# Patient Record
Sex: Female | Born: 1970 | Race: White | Hispanic: No | Marital: Married | Smoking: Never smoker
Health system: Southern US, Community
[De-identification: ages and names within clinical notes are randomized; demographics above are authoritative.]

## PROBLEM LIST (undated history)

## (undated) DIAGNOSIS — T4145XA Adverse effect of unspecified anesthetic, initial encounter: Secondary | ICD-10-CM

## (undated) DIAGNOSIS — J45909 Unspecified asthma, uncomplicated: Secondary | ICD-10-CM

## (undated) DIAGNOSIS — T8859XA Other complications of anesthesia, initial encounter: Secondary | ICD-10-CM

## (undated) DIAGNOSIS — F32A Depression, unspecified: Secondary | ICD-10-CM

## (undated) DIAGNOSIS — G43909 Migraine, unspecified, not intractable, without status migrainosus: Secondary | ICD-10-CM

## (undated) DIAGNOSIS — G629 Polyneuropathy, unspecified: Secondary | ICD-10-CM

## (undated) DIAGNOSIS — R112 Nausea with vomiting, unspecified: Secondary | ICD-10-CM

## (undated) DIAGNOSIS — G932 Benign intracranial hypertension: Secondary | ICD-10-CM

## (undated) DIAGNOSIS — J309 Allergic rhinitis, unspecified: Secondary | ICD-10-CM

## (undated) DIAGNOSIS — A6000 Herpesviral infection of urogenital system, unspecified: Secondary | ICD-10-CM

## (undated) DIAGNOSIS — G4700 Insomnia, unspecified: Secondary | ICD-10-CM

## (undated) DIAGNOSIS — I10 Essential (primary) hypertension: Secondary | ICD-10-CM

## (undated) DIAGNOSIS — F329 Major depressive disorder, single episode, unspecified: Secondary | ICD-10-CM

## (undated) HISTORY — DX: Polyneuropathy, unspecified: G62.9

## (undated) HISTORY — DX: Herpesviral infection of urogenital system, unspecified: A60.00

## (undated) HISTORY — PX: LAPAROSCOPIC TUBAL LIGATION: SHX1937

## (undated) HISTORY — PX: OOPHORECTOMY: SHX86

## (undated) HISTORY — PX: OTHER SURGICAL HISTORY: SHX169

## (undated) HISTORY — DX: Benign intracranial hypertension: G93.2

## (undated) HISTORY — DX: Insomnia, unspecified: G47.00

## (undated) HISTORY — DX: Migraine, unspecified, not intractable, without status migrainosus: G43.909

## (undated) HISTORY — DX: Allergic rhinitis, unspecified: J30.9

## (undated) HISTORY — PX: ABDOMINAL HYSTERECTOMY: SHX81

---
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
## 1999-10-21 ENCOUNTER — Other Ambulatory Visit: Admission: RE | Admit: 1999-10-21 | Discharge: 1999-10-21 | Payer: Self-pay | Admitting: Family Medicine

## 2005-01-29 ENCOUNTER — Other Ambulatory Visit: Admission: RE | Admit: 2005-01-29 | Discharge: 2005-01-29 | Payer: Self-pay | Admitting: Family Medicine

## 2006-04-24 ENCOUNTER — Other Ambulatory Visit: Admission: RE | Admit: 2006-04-24 | Discharge: 2006-04-24 | Payer: Self-pay | Admitting: Family Medicine

## 2006-08-12 ENCOUNTER — Emergency Department (HOSPITAL_COMMUNITY): Admission: EM | Admit: 2006-08-12 | Discharge: 2006-08-12 | Payer: Self-pay | Admitting: Emergency Medicine

## 2007-06-28 ENCOUNTER — Other Ambulatory Visit: Admission: RE | Admit: 2007-06-28 | Discharge: 2007-06-28 | Payer: Self-pay | Admitting: Family Medicine

## 2009-11-26 ENCOUNTER — Encounter: Admission: RE | Admit: 2009-11-26 | Discharge: 2009-11-26 | Payer: Self-pay | Admitting: Family Medicine

## 2010-11-13 ENCOUNTER — Other Ambulatory Visit: Payer: Self-pay | Admitting: Family Medicine

## 2010-11-13 DIAGNOSIS — Z1231 Encounter for screening mammogram for malignant neoplasm of breast: Secondary | ICD-10-CM

## 2010-12-11 ENCOUNTER — Other Ambulatory Visit: Payer: Self-pay | Admitting: Family Medicine

## 2010-12-11 ENCOUNTER — Ambulatory Visit: Payer: Self-pay

## 2010-12-11 DIAGNOSIS — Z1231 Encounter for screening mammogram for malignant neoplasm of breast: Secondary | ICD-10-CM

## 2010-12-24 ENCOUNTER — Ambulatory Visit
Admission: RE | Admit: 2010-12-24 | Discharge: 2010-12-24 | Disposition: A | Discharge status: Home / Self Care | Payer: Self-pay | Source: Ambulatory Visit | Attending: Family Medicine | Admitting: Family Medicine

## 2010-12-24 ENCOUNTER — Ambulatory Visit: Payer: Self-pay

## 2010-12-24 DIAGNOSIS — Z1231 Encounter for screening mammogram for malignant neoplasm of breast: Secondary | ICD-10-CM

## 2012-01-07 ENCOUNTER — Other Ambulatory Visit: Payer: Self-pay | Admitting: Family Medicine

## 2012-01-07 DIAGNOSIS — Z1231 Encounter for screening mammogram for malignant neoplasm of breast: Secondary | ICD-10-CM

## 2012-01-13 ENCOUNTER — Ambulatory Visit (INDEPENDENT_AMBULATORY_CARE_PROVIDER_SITE_OTHER): Payer: Federal, State, Local not specified - PPO

## 2012-01-13 DIAGNOSIS — Z1231 Encounter for screening mammogram for malignant neoplasm of breast: Secondary | ICD-10-CM

## 2013-03-15 ENCOUNTER — Other Ambulatory Visit: Payer: Self-pay | Admitting: Family Medicine

## 2013-03-15 DIAGNOSIS — Z139 Encounter for screening, unspecified: Secondary | ICD-10-CM

## 2013-03-17 ENCOUNTER — Ambulatory Visit (INDEPENDENT_AMBULATORY_CARE_PROVIDER_SITE_OTHER): Payer: Federal, State, Local not specified - PPO

## 2013-03-17 DIAGNOSIS — Z1231 Encounter for screening mammogram for malignant neoplasm of breast: Secondary | ICD-10-CM

## 2013-03-17 DIAGNOSIS — Z139 Encounter for screening, unspecified: Secondary | ICD-10-CM

## 2014-04-19 ENCOUNTER — Other Ambulatory Visit (HOSPITAL_BASED_OUTPATIENT_CLINIC_OR_DEPARTMENT_OTHER): Payer: Self-pay | Admitting: Family Medicine

## 2014-04-19 DIAGNOSIS — Z1231 Encounter for screening mammogram for malignant neoplasm of breast: Secondary | ICD-10-CM

## 2014-04-28 ENCOUNTER — Ambulatory Visit (HOSPITAL_BASED_OUTPATIENT_CLINIC_OR_DEPARTMENT_OTHER)
Admission: RE | Admit: 2014-04-28 | Discharge: 2014-04-28 | Disposition: A | Discharge status: Home / Self Care | Payer: Federal, State, Local not specified - PPO | Source: Ambulatory Visit | Attending: Family Medicine | Admitting: Family Medicine

## 2014-04-28 DIAGNOSIS — Z1231 Encounter for screening mammogram for malignant neoplasm of breast: Secondary | ICD-10-CM | POA: Diagnosis not present

## 2014-06-09 ENCOUNTER — Other Ambulatory Visit (HOSPITAL_BASED_OUTPATIENT_CLINIC_OR_DEPARTMENT_OTHER): Payer: Self-pay | Admitting: Family Medicine

## 2014-06-09 ENCOUNTER — Ambulatory Visit (HOSPITAL_BASED_OUTPATIENT_CLINIC_OR_DEPARTMENT_OTHER)
Admission: RE | Admit: 2014-06-09 | Discharge: 2014-06-09 | Disposition: A | Discharge status: Home / Self Care | Payer: Federal, State, Local not specified - PPO | Source: Ambulatory Visit | Attending: Family Medicine | Admitting: Family Medicine

## 2014-06-09 DIAGNOSIS — M79671 Pain in right foot: Secondary | ICD-10-CM

## 2015-03-30 ENCOUNTER — Encounter (HOSPITAL_BASED_OUTPATIENT_CLINIC_OR_DEPARTMENT_OTHER): Payer: Self-pay | Admitting: *Deleted

## 2015-03-30 ENCOUNTER — Emergency Department (HOSPITAL_BASED_OUTPATIENT_CLINIC_OR_DEPARTMENT_OTHER)
Admission: EM | Admit: 2015-03-30 | Discharge: 2015-03-30 | Disposition: A | Discharge status: Home / Self Care | Payer: Federal, State, Local not specified - PPO | Attending: Emergency Medicine | Admitting: Emergency Medicine

## 2015-03-30 DIAGNOSIS — S61012A Laceration without foreign body of left thumb without damage to nail, initial encounter: Secondary | ICD-10-CM | POA: Insufficient documentation

## 2015-03-30 DIAGNOSIS — Z91040 Latex allergy status: Secondary | ICD-10-CM | POA: Diagnosis not present

## 2015-03-30 DIAGNOSIS — W260XXA Contact with knife, initial encounter: Secondary | ICD-10-CM | POA: Diagnosis not present

## 2015-03-30 DIAGNOSIS — Y998 Other external cause status: Secondary | ICD-10-CM | POA: Insufficient documentation

## 2015-03-30 DIAGNOSIS — Z79899 Other long term (current) drug therapy: Secondary | ICD-10-CM | POA: Diagnosis not present

## 2015-03-30 DIAGNOSIS — Y9389 Activity, other specified: Secondary | ICD-10-CM | POA: Insufficient documentation

## 2015-03-30 DIAGNOSIS — Y9289 Other specified places as the place of occurrence of the external cause: Secondary | ICD-10-CM | POA: Diagnosis not present

## 2015-03-30 DIAGNOSIS — Z8659 Personal history of other mental and behavioral disorders: Secondary | ICD-10-CM | POA: Insufficient documentation

## 2015-03-30 DIAGNOSIS — J45909 Unspecified asthma, uncomplicated: Secondary | ICD-10-CM | POA: Insufficient documentation

## 2015-03-30 DIAGNOSIS — I10 Essential (primary) hypertension: Secondary | ICD-10-CM | POA: Diagnosis not present

## 2015-03-30 DIAGNOSIS — Z23 Encounter for immunization: Secondary | ICD-10-CM | POA: Diagnosis not present

## 2015-03-30 HISTORY — DX: Essential (primary) hypertension: I10

## 2015-03-30 HISTORY — DX: Major depressive disorder, single episode, unspecified: F32.9

## 2015-03-30 HISTORY — DX: Unspecified asthma, uncomplicated: J45.909

## 2015-03-30 HISTORY — DX: Depression, unspecified: F32.A

## 2015-03-30 MED ORDER — LIDOCAINE HCL (PF) 2 % IJ SOLN
INTRAMUSCULAR | Status: AC
  Filled 2015-03-30: qty 4

## 2015-03-30 MED ORDER — LIDOCAINE HCL (PF) 1 % IJ SOLN: 5.0000 mL | Freq: Once | INTRAMUSCULAR | Status: DC

## 2015-03-30 MED ORDER — LIDOCAINE HCL (PF) 1 % IJ SOLN
INTRAMUSCULAR | Status: AC
  Filled 2015-03-30: qty 5

## 2015-03-30 MED ORDER — TETANUS-DIPHTH-ACELL PERTUSSIS 5-2.5-18.5 LF-MCG/0.5 IM SUSP
0.5000 mL | Freq: Once | INTRAMUSCULAR | Status: AC
  Administered 2015-03-30: 0.5 mL via INTRAMUSCULAR
  Filled 2015-03-30: qty 0.5

## 2015-03-30 MED ORDER — LIDOCAINE HCL 2 % IJ SOLN
10.0000 mL | Freq: Once | INTRAMUSCULAR | Status: AC
  Administered 2015-03-30: 200 mg

## 2015-03-30 NOTE — Discharge Instructions (Signed)
Sutures need to be removed in 7 days. That can be done here, at an urgent care center, or at a physician's office.  Laceration Care, Adult A laceration is a cut that goes through all of the layers of the skin and into the tissue that is right under the skin. Some lacerations heal on their own. Others need to be closed with stitches (sutures), staples, skin adhesive strips, or skin glue. Proper laceration care minimizes the risk of infection and helps the laceration to heal better. HOW TO CARE FOR YOUR LACERATION If sutures or staples were used:  Keep the wound clean and dry.  If you were given a bandage (dressing), you should change it at least one time per day or as told by your health care provider. You should also change it if it becomes wet or dirty.  Keep the wound completely dry for the first 24 hours or as told by your health care provider. After that time, you may shower or bathe. However, make sure that the wound is not soaked in water until after the sutures or staples have been removed.  Clean the wound one time each day or as told by your health care provider:  Wash the wound with soap and water.  Rinse the wound with water to remove all soap.  Pat the wound dry with a clean towel. Do not rub the wound.  After cleaning the wound, apply a thin layer of antibiotic ointmentas told by your health care provider. This will help to prevent infection and keep the dressing from sticking to the wound.  Have the sutures or staples removed as told by your health care provider. If skin adhesive strips were used:  Keep the wound clean and dry.  If you were given a bandage (dressing), you should change it at least one time per day or as told by your health care provider. You should also change it if it becomes dirty or wet.  Do not get the skin adhesive strips wet. You may shower or bathe, but be careful to keep the wound dry.  If the wound gets wet, pat it dry with a clean towel. Do  not rub the wound.  Skin adhesive strips fall off on their own. You may trim the strips as the wound heals. Do not remove skin adhesive strips that are still stuck to the wound. They will fall off in time. If skin glue was used:  Try to keep the wound dry, but you may briefly wet it in the shower or bath. Do not soak the wound in water, such as by swimming.  After you have showered or bathed, gently pat the wound dry with a clean towel. Do not rub the wound.  Do not do any activities that will make you sweat heavily until the skin glue has fallen off on its own.  Do not apply liquid, cream, or ointment medicine to the wound while the skin glue is in place. Using those may loosen the film before the wound has healed.  If you were given a bandage (dressing), you should change it at least one time per day or as told by your health care provider. You should also change it if it becomes dirty or wet.  If a dressing is placed over the wound, be careful not to apply tape directly over the skin glue. Doing that may cause the glue to be pulled off before the wound has healed.  Do not pick at the glue.  The skin glue usually remains in place for 5-10 days, then it falls off of the skin. General Instructions  Take over-the-counter and prescription medicines only as told by your health care provider.  If you were prescribed an antibiotic medicine or ointment, take or apply it as told by your doctor. Do not stop using it even if your condition improves.  To help prevent scarring, make sure to cover your wound with sunscreen whenever you are outside after stitches are removed, after adhesive strips are removed, or when glue remains in place and the wound is healed. Make sure to wear a sunscreen of at least 30 SPF.  Do not scratch or pick at the wound.  Keep all follow-up visits as told by your health care provider. This is important.  Check your wound every day for signs of infection. Watch  for:  Redness, swelling, or pain.  Fluid, blood, or pus.  Raise (elevate) the injured area above the level of your heart while you are sitting or lying down, if possible. SEEK MEDICAL CARE IF:  You received a tetanus shot and you have swelling, severe pain, redness, or bleeding at the injection site.  You have a fever.  A wound that was closed breaks open.  You notice a bad smell coming from your wound or your dressing.  You notice something coming out of the wound, such as wood or glass.  Your pain is not controlled with medicine.  You have increased redness, swelling, or pain at the site of your wound.  You have fluid, blood, or pus coming from your wound.  You notice a change in the color of your skin near your wound.  You need to change the dressing frequently due to fluid, blood, or pus draining from the wound.  You develop a new rash.  You develop numbness around the wound. SEEK IMMEDIATE MEDICAL CARE IF:  You develop severe swelling around the wound.  Your pain suddenly increases and is severe.  You develop painful lumps near the wound or on skin that is anywhere on your body.  You have a red streak going away from your wound.  The wound is on your hand or foot and you cannot properly move a finger or toe.  The wound is on your hand or foot and you notice that your fingers or toes look pale or bluish.   This information is not intended to replace advice given to you by your health care provider. Make sure you discuss any questions you have with your health care provider.   Document Released: 06/09/2005 Document Revised: 10/24/2014 Document Reviewed: 06/05/2014 Elsevier Interactive Patient Education 2016 [employer] (Tetanus and Diphtheria): What You Need to Know 1. Why get vaccinated? Tetanus  and diphtheria are very serious diseases. They are rare in the [place] today, but people who do become infected often have severe complications.  Td vaccine is used to protect adolescents and adults from both of these diseases. Both tetanus and diphtheria are infections caused by bacteria. Diphtheria spreads from person to person through coughing or sneezing. Tetanus-causing bacteria enter the body through cuts, scratches, or wounds. TETANUS (Lockjaw) causes painful muscle tightening and stiffness, usually all over the body.  It can lead to tightening of muscles in the head and neck so you can't open your mouth, swallow, or sometimes even breathe. Tetanus kills about 1 out of every 10 people who are infected even after receiving the best medical care. DIPHTHERIA can cause  a thick coating to form in the back of the throat.  It can lead to breathing problems, paralysis, heart failure, and death. Before vaccines, as many as 200,000 cases of diphtheria and hundreds of cases of tetanus were reported in the [place] each year. Since vaccination began, reports of cases for both diseases have dropped by about 99%. 2. Td vaccine Td vaccine can protect adolescents and adults from tetanus and diphtheria. Td is usually given as a booster dose every 10 years but it can also be given earlier after a severe and dirty wound or burn. Another vaccine, called Tdap, which protects against pertussis in addition to tetanus and diphtheria, is sometimes recommended instead of Td vaccine. Your doctor or the person giving you the vaccine can give you more information. Td may safely be given at the same time as other vaccines. 3. Some people should not get this vaccine  A person who has ever had a life-threatening allergic reaction after a previous dose of any tetanus or diphtheria containing vaccine, OR has a severe allergy to any part of this vaccine, should not get Td vaccine. Tell the person giving the vaccine about any severe allergies.  Talk to your doctor if you:  have seizures or another nervous system problem,  had severe pain or swelling after any  vaccine containing diphtheria or tetanus,  ever had a condition called Guillain Barre Syndrome (GBS),  aren't feeling well on the day the shot is scheduled. 4. Risks of a vaccine reaction With any medicine, including vaccines, there is a chance of side effects. These are usually mild and go away on their own. Serious reactions are also possible but are rare. Most people who get Td vaccine do not have any problems with it. Mild Problems  following Td vaccine: (Did not interfere with activities)  Pain where the shot was given (about 8 people in 10)  Redness or swelling where the shot was given (about 1 person in 4)  Mild fever (rare)  Headache (about 1 person in 4)  Tiredness (about 1 person in 4) Moderate Problems following Td vaccine: (Interfered with activities, but did not require medical attention)  Fever over 102F (rare) Severe Problems  following Td vaccine: (Unable to perform usual activities; required medical attention)  Swelling, severe pain, bleeding and/or redness in the arm where the shot was given (rare). Problems that could happen after any vaccine:  People sometimes faint after a medical procedure, including vaccination. Sitting or lying down for about 15 minutes can help prevent fainting, and injuries caused by a fall. Tell your doctor if you feel dizzy, or have vision changes or ringing in the ears.  Some people get severe pain in the shoulder and have difficulty moving the arm where a shot was given. This happens very rarely.  Any medication can cause a severe allergic reaction. Such reactions from a vaccine are very rare, estimated at fewer than 1 in a million doses, and would happen within a few minutes to a few hours after the vaccination. As with any medicine, there is a very remote chance of a vaccine causing a serious injury or death. The safety of vaccines is always being monitored. For more information, visit: http://www.aguilar.org/ 5. What if there  is a serious reaction? What should I look for?  Look for anything that concerns you, such as signs of a severe allergic reaction, very high fever, or unusual behavior. Signs of a severe allergic reaction can include hives, swelling of the  face and throat, difficulty breathing, a fast heartbeat, dizziness, and weakness. These would usually start a few minutes to a few hours after the vaccination. What should I do?  If you think it is a severe allergic reaction or other emergency that can't wait, call 9-1-1 or get the person to the nearest hospital. Otherwise, call your doctor.  Afterward, the reaction should be reported to the Vaccine Adverse Event Reporting System (VAERS). Your doctor might file this report, or you can do it yourself through the VAERS web site at www.vaers.SamedayNews.es, or by calling 972-292-5813. VAERS does not give medical advice. 6. The National Vaccine Injury Compensation Program The [employer] Vaccine Injury Compensation Program (VICP) is a federal program that was created to compensate people who may have been injured by certain vaccines. Persons who believe they may have been injured by a vaccine can learn about the program and about filing a claim by calling 4148775241 or visiting the [employer] website at GoldCloset.com.ee. There is a time limit to file a claim for compensation. 7. How can I learn more?  Ask your doctor. He or she can give you the vaccine package insert or suggest other sources of information.  Call your local or state health department.  Contact the Centers for Disease Control and Prevention (CDC):  Call (717)703-4724 (1-800-CDC-INFO)  Visit CDC's website at http://hunter.com/ CDC Td Vaccine VIS (08/16/13)   This information is not intended to replace advice given to you by your health care provider. Make sure you discuss any questions you have with your health care provider.   Document Released: 04/06/2006 Document Revised:  06/30/2014 Document Reviewed: 09/21/2013 Elsevier Interactive Patient Education [employer].

## 2015-03-30 NOTE — ED Provider Notes (Signed)
CSN: [id number]     Arrival date & time 03/30/15  0053 History   First MD Initiated Contact with Patient 03/30/15 0124     Chief Complaint  Patient presents with  . Extremity Laceration     (Consider location/radiation/quality/duration/timing/severity/associated sxs/prior Treatment) The history is provided by the patient.   52 showed female suffered a laceration to the pad of her left thumb with a knife. She does not known her last tetanus immunization was. She denies other injury.  Past Medical History  Diagnosis Date  . Hypertension   . Asthma   . Depression    No past surgical history on file. No family history on file. Social History  Substance Use Topics  . Smoking status: Not on file  . Smokeless tobacco: Not on file  . Alcohol Use: Not on file   OB History    No data available     Review of Systems  All other systems reviewed and are negative.     Allergies  Latex  Home Medications   Prior to Admission medications   Medication Sig Start Date End Date Taking? Authorizing Provider  amitriptyline (ELAVIL) 10 MG tablet Take 10 mg by mouth at bedtime.   Yes Historical Provider, MD  montelukast (SINGULAIR) 10 MG tablet Take 10 mg by mouth at bedtime.   Yes Historical Provider, MD  verapamil (CALAN) 120 MG tablet Take 120 mg by mouth 3 (three) times daily.   Yes Historical Provider, MD   BP 154/91 mmHg  Pulse 101  Temp(Src) 98.4 F (36.9 C) (Oral)  Resp 20  Ht 5\' 6"  (1.676 m)  Wt 270 lb (122.471 kg)  BMI 43.60 kg/m2  SpO2 96% Physical Exam  Nursing note and vitals reviewed.  44 year old female, resting comfortably and in no acute distress. Vital signs are significant for hypertension and borderline tachycardia. Oxygen saturation is 96%, which is normal. Head is normocephalic and atraumatic. PERRLA, EOMI. Oropharynx is clear. Neck is nontender and supple without adenopathy or JVD. Back is nontender and there is no CVA tenderness. Lungs are clear without  rales, wheezes, or rhonchi. Chest is nontender. Heart has regular rate and rhythm without murmur. Abdomen is soft, flat, nontender without masses or hepatosplenomegaly and peristalsis is normoactive. Extremities have no cyanosis or edema, full range of motion is present. Laceration is present across the pad of the left thumb. Skin is warm and dry without rash. Neurologic: Mental status is normal, cranial nerves are intact, there are no motor or sensory deficits.  ED Course  Procedures (including critical care time) LACERATION REPAIR Performed by: TTSVX,BLTJQ Authorized by: ZESPQ,ZRAQT Consent: Verbal consent obtained. Risks and benefits: risks, benefits and alternatives were discussed Consent given by: patient Patient identity confirmed: provided demographic data Prepped and Draped in normal sterile fashion Wound explored  Laceration Location: left thumb  Laceration Length: 5.0 cm  No Foreign Bodies seen or palpated  Anesthesia: digital block  Local anesthetic: lidocaine 2% without epinephrine  Anesthetic total: 10 ml  Amount of cleaning: standard  Skin closure: close  Number of sutures: 11  Technique: Simple interrupted sutures with 5-0 Nylon  Patient tolerance: Patient tolerated the procedure well with no immediate complications.   MDM   Final diagnoses:  Laceration of left thumb, initial encounter    Laceration of the pad of left thumb treated with sutures. TDaP booster is given. She is discharged with instructions to have sutures removed in 7 days.    Delora Fuel, MD 62/26/33 [id number]

## 2015-03-30 NOTE — ED Notes (Signed)
Lac to thumb of left hand  approx 1.5 inches  Some bleeding noted

## 2015-03-30 NOTE — ED Notes (Signed)
Lac to left thumb w kitchen approx 1.5 inches, some bleeding noted

## 2015-05-16 ENCOUNTER — Other Ambulatory Visit: Payer: Self-pay | Admitting: Family Medicine

## 2015-05-16 DIAGNOSIS — Z1231 Encounter for screening mammogram for malignant neoplasm of breast: Secondary | ICD-10-CM

## 2015-05-30 ENCOUNTER — Ambulatory Visit (INDEPENDENT_AMBULATORY_CARE_PROVIDER_SITE_OTHER): Payer: Federal, State, Local not specified - PPO

## 2015-05-30 DIAGNOSIS — Z1231 Encounter for screening mammogram for malignant neoplasm of breast: Secondary | ICD-10-CM | POA: Diagnosis not present

## 2015-11-11 ENCOUNTER — Emergency Department (HOSPITAL_COMMUNITY)
Admission: EM | Admit: 2015-11-11 | Discharge: 2015-11-11 | Disposition: A | Discharge status: Home / Self Care | Payer: Federal, State, Local not specified - PPO | Attending: Emergency Medicine | Admitting: Emergency Medicine

## 2015-11-11 ENCOUNTER — Emergency Department (HOSPITAL_COMMUNITY): Payer: Federal, State, Local not specified - PPO

## 2015-11-11 ENCOUNTER — Encounter (HOSPITAL_COMMUNITY): Payer: Self-pay

## 2015-11-11 DIAGNOSIS — R000 Tachycardia, unspecified: Secondary | ICD-10-CM | POA: Insufficient documentation

## 2015-11-11 DIAGNOSIS — J069 Acute upper respiratory infection, unspecified: Secondary | ICD-10-CM | POA: Diagnosis not present

## 2015-11-11 DIAGNOSIS — J019 Acute sinusitis, unspecified: Secondary | ICD-10-CM | POA: Insufficient documentation

## 2015-11-11 DIAGNOSIS — F329 Major depressive disorder, single episode, unspecified: Secondary | ICD-10-CM | POA: Insufficient documentation

## 2015-11-11 DIAGNOSIS — R51 Headache: Secondary | ICD-10-CM | POA: Diagnosis present

## 2015-11-11 DIAGNOSIS — R509 Fever, unspecified: Secondary | ICD-10-CM | POA: Insufficient documentation

## 2015-11-11 DIAGNOSIS — I10 Essential (primary) hypertension: Secondary | ICD-10-CM | POA: Diagnosis not present

## 2015-11-11 DIAGNOSIS — J45909 Unspecified asthma, uncomplicated: Secondary | ICD-10-CM | POA: Diagnosis not present

## 2015-11-11 LAB — COMPREHENSIVE METABOLIC PANEL
ALBUMIN: 3.9 g/dL (ref 3.5–5.0)
ALT: 26 U/L (ref 14–54)
ANION GAP: 10 (ref 5–15)
AST: 21 U/L (ref 15–41)
Alkaline Phosphatase: 64 U/L (ref 38–126)
BILIRUBIN TOTAL: 0.5 mg/dL (ref 0.3–1.2)
BUN: 13 mg/dL (ref 6–20)
CHLORIDE: 105 mmol/L (ref 101–111)
CO2: 20 mmol/L — ABNORMAL LOW (ref 22–32)
Calcium: 8.6 mg/dL — ABNORMAL LOW (ref 8.9–10.3)
Creatinine, Ser: 0.66 mg/dL (ref 0.44–1.00)
GFR calc Af Amer: >60 mL/min (ref >60)
Glucose, Bld: 115 mg/dL — ABNORMAL HIGH (ref 65–99)
POTASSIUM: 3.7 mmol/L (ref 3.5–5.1)
Sodium: 135 mmol/L (ref 135–145)
TOTAL PROTEIN: 7.6 g/dL (ref 6.5–8.1)

## 2015-11-11 LAB — CBC
HEMATOCRIT: 36.3 % (ref 36.0–46.0)
Hemoglobin: 12.1 g/dL (ref 12.0–15.0)
MCH: 28.5 pg (ref 26.0–34.0)
MCHC: 33.3 g/dL (ref 30.0–36.0)
MCV: 85.6 fL (ref 78.0–100.0)
PLATELETS: 236 10*3/uL (ref 150–400)
RBC: 4.24 MIL/uL (ref 3.87–5.11)
RDW: 13.8 % (ref 11.5–15.5)
WBC: 13.3 10*3/uL — ABNORMAL HIGH (ref 4.0–10.5)

## 2015-11-11 LAB — I-STAT BETA HCG BLOOD, ED (MC, WL, AP ONLY)

## 2015-11-11 MED ORDER — DEXAMETHASONE SODIUM PHOSPHATE 10 MG/ML IJ SOLN
10.0000 mg | Freq: Once | INTRAMUSCULAR | Status: AC
  Administered 2015-11-11: 10 mg via INTRAVENOUS
  Filled 2015-11-11: qty 1

## 2015-11-11 MED ORDER — SODIUM CHLORIDE 0.9 % IV BOLUS (SEPSIS)
1000.0000 mL | Freq: Once | INTRAVENOUS | Status: AC
  Administered 2015-11-11: 1000 mL via INTRAVENOUS

## 2015-11-11 MED ORDER — KETOROLAC TROMETHAMINE 30 MG/ML IJ SOLN
30.0000 mg | Freq: Once | INTRAMUSCULAR | Status: AC
  Administered 2015-11-11: 30 mg via INTRAVENOUS
  Filled 2015-11-11: qty 1

## 2015-11-11 MED ORDER — AMOXICILLIN-POT CLAVULANATE 875-125 MG PO TABS: 1.0000 | ORAL_TABLET | Freq: Two times a day (BID) | ORAL | Status: DC

## 2015-11-11 MED ORDER — ALBUTEROL SULFATE (2.5 MG/3ML) 0.083% IN NEBU
5.0000 mg | INHALATION_SOLUTION | Freq: Once | RESPIRATORY_TRACT | Status: AC
  Administered 2015-11-11: 5 mg via RESPIRATORY_TRACT
  Filled 2015-11-11: qty 6

## 2015-11-11 MED ORDER — IPRATROPIUM BROMIDE 0.02 % IN SOLN
0.5000 mg | Freq: Once | RESPIRATORY_TRACT | Status: AC
  Administered 2015-11-11: 0.5 mg via RESPIRATORY_TRACT
  Filled 2015-11-11: qty 2.5

## 2015-11-11 NOTE — ED Notes (Signed)
She c/o facial "pressure" and fever (states 102 at home).  Facial pressure and occasional chills began two days ago.  She is in no distress.

## 2015-11-11 NOTE — Discharge Instructions (Signed)
Take the antibiotics prescribed. Rest at home. Drink lots of fluids. Follow-up with your primary care physician if your symptoms persist.  Sinusitis, Adult Sinusitis is redness, soreness, and inflammation of the paranasal sinuses. Paranasal sinuses are air pockets within the bones of your face. They are located beneath your eyes, in the middle of your forehead, and above your eyes. In healthy paranasal sinuses, mucus is able to drain out, and air is able to circulate through them by way of your nose. However, when your paranasal sinuses are inflamed, mucus and air can become trapped. This can allow bacteria and other germs to grow and cause infection. Sinusitis can develop quickly and last only a short time (acute) or continue over a long period (chronic). Sinusitis that lasts for more than 12 weeks is considered chronic. CAUSES Causes of sinusitis include:  Allergies.  Structural abnormalities, such as displacement of the cartilage that separates your nostrils (deviated septum), which can decrease the air flow through your nose and sinuses and affect sinus drainage.  Functional abnormalities, such as when the small hairs (cilia) that line your sinuses and help remove mucus do not work properly or are not present. SIGNS AND SYMPTOMS Symptoms of acute and chronic sinusitis are the same. The primary symptoms are pain and pressure around the affected sinuses. Other symptoms include:  Upper toothache.  Earache.  Headache.  Bad breath.  Decreased sense of smell and taste.  A cough, which worsens when you are lying flat.  Fatigue.  Fever.  Thick drainage from your nose, which often is green and may contain pus (purulent).  Swelling and warmth over the affected sinuses. DIAGNOSIS Your health care provider will perform a physical exam. During your exam, your health care provider may perform any of the following to help determine if you have acute sinusitis or chronic sinusitis:  Look in  your nose for signs of abnormal growths in your nostrils (nasal polyps).  Tap over the affected sinus to check for signs of infection.  View the inside of your sinuses using an imaging device that has a light attached (endoscope). If your health care provider suspects that you have chronic sinusitis, one or more of the following tests may be recommended:  Allergy tests.  Nasal culture. A sample of mucus is taken from your nose, sent to a lab, and screened for bacteria.  Nasal cytology. A sample of mucus is taken from your nose and examined by your health care provider to determine if your sinusitis is related to an allergy. TREATMENT Most cases of acute sinusitis are related to a viral infection and will resolve on their own within 10 days. Sometimes, medicines are prescribed to help relieve symptoms of both acute and chronic sinusitis. These may include pain medicines, decongestants, nasal steroid sprays, or saline sprays. However, for sinusitis related to a bacterial infection, your health care provider will prescribe antibiotic medicines. These are medicines that will help kill the bacteria causing the infection. Rarely, sinusitis is caused by a fungal infection. In these cases, your health care provider will prescribe antifungal medicine. For some cases of chronic sinusitis, surgery is needed. Generally, these are cases in which sinusitis recurs more than 3 times per year, despite other treatments. HOME CARE INSTRUCTIONS  Drink plenty of water. Water helps thin the mucus so your sinuses can drain more easily.  Use a humidifier.  Inhale steam 3-4 times a day (for example, sit in the bathroom with the shower running).  Apply a warm, moist washcloth  to your face 3-4 times a day, or as directed by your health care provider.  Use saline nasal sprays to help moisten and clean your sinuses.  Take medicines only as directed by your health care provider.  If you were prescribed either an  antibiotic or antifungal medicine, finish it all even if you start to feel better. SEEK IMMEDIATE MEDICAL CARE IF:  You have increasing pain or severe headaches.  You have nausea, vomiting, or drowsiness.  You have swelling around your face.  You have vision problems.  You have a stiff neck.  You have difficulty breathing.   This information is not intended to replace advice given to you by your health care provider. Make sure you discuss any questions you have with your health care provider.   Document Released: 06/09/2005 Document Revised: 06/30/2014 Document Reviewed: 06/24/2011 Elsevier Interactive Patient Education 2016 [employer]  Upper Respiratory Infection, Adult Most upper respiratory infections (URIs) are a viral infection of the air passages leading to the lungs. A URI affects the nose, throat, and upper air passages. The most common type of URI is nasopharyngitis and is typically referred to as "the common cold." URIs run their course and usually go away on their own. Most of the time, a URI does not require medical attention, but sometimes a bacterial infection in the upper airways can follow a viral infection. This is called a secondary infection. Sinus and middle ear infections are common types of secondary upper respiratory infections. Bacterial pneumonia can also complicate a URI. A URI can worsen asthma and chronic obstructive pulmonary disease (COPD). Sometimes, these complications can require emergency medical care and may be life threatening.  CAUSES Almost all URIs are caused by viruses. A virus is a type of germ and can spread from one person to another.  RISKS FACTORS You may be at risk for a URI if:   You smoke.   You have chronic heart or lung disease.  You have a weakened defense (immune) system.   You are very young or very old.   You have nasal allergies or asthma.  You work in crowded or poorly ventilated areas.  You work in health care  facilities or schools. SIGNS AND SYMPTOMS  Symptoms typically develop 2-3 days after you come in contact with a cold virus. Most viral URIs last 7-10 days. However, viral URIs from the influenza virus (flu virus) can last 14-18 days and are typically more severe. Symptoms may include:   Runny or stuffy (congested) nose.   Sneezing.   Cough.   Sore throat.   Headache.   Fatigue.   Fever.   Loss of appetite.   Pain in your forehead, behind your eyes, and over your cheekbones (sinus pain).  Muscle aches.  DIAGNOSIS  Your health care provider may diagnose a URI by:  Physical exam.  Tests to check that your symptoms are not due to another condition such as:  Strep throat.  Sinusitis.  Pneumonia.  Asthma. TREATMENT  A URI goes away on its own with time. It cannot be cured with medicines, but medicines may be prescribed or recommended to relieve symptoms. Medicines may help:  Reduce your fever.  Reduce your cough.  Relieve nasal congestion. HOME CARE INSTRUCTIONS   Take medicines only as directed by your health care provider.   Gargle warm saltwater or take cough drops to comfort your throat as directed by your health care provider.  Use a warm mist humidifier or inhale steam from  a shower to increase air moisture. This may make it easier to breathe.  Drink enough fluid to keep your urine clear or pale yellow.   Eat soups and other clear broths and maintain good nutrition.   Rest as needed.   Return to work when your temperature has returned to normal or as your health care provider advises. You may need to stay home longer to avoid infecting others. You can also use a face mask and careful hand washing to prevent spread of the virus.  Increase the usage of your inhaler if you have asthma.   Do not use any tobacco products, including cigarettes, chewing tobacco, or electronic cigarettes. If you need help quitting, ask your health care  provider. PREVENTION  The best way to protect yourself from getting a cold is to practice good hygiene.   Avoid oral or hand contact with people with cold symptoms.   Wash your hands often if contact occurs.  There is no clear evidence that vitamin C, vitamin E, echinacea, or exercise reduces the chance of developing a cold. However, it is always recommended to get plenty of rest, exercise, and practice good nutrition.  SEEK MEDICAL CARE IF:   You are getting worse rather than better.   Your symptoms are not controlled by medicine.   You have chills.  You have worsening shortness of breath.  You have brown or red mucus.  You have yellow or brown nasal discharge.  You have pain in your face, especially when you bend forward.  You have a fever.  You have swollen neck glands.  You have pain while swallowing.  You have white areas in the back of your throat. SEEK IMMEDIATE MEDICAL CARE IF:   You have severe or persistent:  Headache.  Ear pain.  Sinus pain.  Chest pain.  You have chronic lung disease and any of the following:  Wheezing.  Prolonged cough.  Coughing up blood.  A change in your usual mucus.  You have a stiff neck.  You have changes in your:  Vision.  Hearing.  Thinking.  Mood. MAKE SURE YOU:   Understand these instructions.  Will watch your condition.  Will get help right away if you are not doing well or get worse.   This information is not intended to replace advice given to you by your health care provider. Make sure you discuss any questions you have with your health care provider.   Document Released: 12/03/2000 Document Revised: 10/24/2014 Document Reviewed: 09/14/2013 Elsevier Interactive Patient Education [employer].

## 2015-11-11 NOTE — ED Notes (Signed)
Bed: WA03 Expected date:  Expected time:  Means of arrival:  Comments: EMS- 45 yo fever, given Tylenol

## 2015-11-11 NOTE — ED Provider Notes (Signed)
CSN: [id number]     Arrival date & time 11/11/15  1309 History   First MD Initiated Contact with Patient 11/11/15 1327     Chief Complaint  Patient presents with  . Facial Pain     (Consider location/radiation/quality/duration/timing/severity/associated sxs/prior Treatment) HPI Comments: 46 her old female with history of asthma, hypertension presents for fever and shortness of breath. The patient reports that over the last 4 days she has had a fever and just generally has not been feeling well. She has felt over this time that she has had a sinus headache. Today she developed shortness of breath while at church that was relieved somewhat by taking someone else's inhaler because she did not have her start with her. She reports that since this morning she has had some pain in her right chest although it did improve with the inhaler. No vomiting or diarrhea. No abdominal pain. Normal urinary patterns.   Past Medical History  Diagnosis Date  . Hypertension   . Asthma   . Depression    Past Surgical History  Procedure Laterality Date  . Cesarean section     No family history on file. Social History  Substance Use Topics  . Smoking status: Never Smoker   . Smokeless tobacco: None  . Alcohol Use: No   OB History    No data available     Review of Systems  Constitutional: Positive for fever and fatigue. Negative for chills and appetite change.  HENT: Positive for congestion, rhinorrhea and sinus pressure. Negative for postnasal drip, sneezing and sore throat.   Eyes: Negative for visual disturbance.  Respiratory: Positive for shortness of breath. Negative for cough and chest tightness.   Cardiovascular: Positive for chest pain. Negative for palpitations.  Gastrointestinal: Negative for nausea, vomiting, abdominal pain and diarrhea.  Genitourinary: Negative for dysuria, urgency and hematuria.  Musculoskeletal: Negative for myalgias and back pain.  Skin: Negative for rash.   Neurological: Negative for dizziness, seizures, weakness, numbness and headaches.  Hematological: Does not bruise/bleed easily.      Allergies  Prednisone and Latex  Home Medications   Prior to Admission medications   Medication Sig Start Date End Date Taking? Authorizing Provider  albuterol (PROVENTIL HFA;VENTOLIN HFA) 108 (90 Base) MCG/ACT inhaler Inhale 1-2 puffs into the lungs every 6 (six) hours as needed for wheezing or shortness of breath.   Yes Historical Provider, MD  amitriptyline (ELAVIL) 25 MG tablet Take 75 mg by mouth at bedtime.   Yes Historical Provider, MD  DULERA 100-5 MCG/ACT AERO Inhale 2 puffs into the lungs at bedtime. 11/05/15  Yes Historical Provider, MD  ibuprofen (ADVIL) 200 MG tablet Take 400 mg by mouth every 6 (six) hours as needed for moderate pain.   Yes Historical Provider, MD  montelukast (SINGULAIR) 10 MG tablet Take 10 mg by mouth at bedtime.   Yes Historical Provider, MD  verapamil (VERELAN PM) 240 MG 24 hr capsule Take 240 mg by mouth at bedtime.   Yes Historical Provider, MD  amoxicillin-clavulanate (AUGMENTIN) 875-125 MG tablet Take 1 tablet by mouth every 12 (twelve) hours. 11/11/15   Harvel Quale, MD   BP 125/70 mmHg  Pulse 87  Temp(Src) 98.9 F (37.2 C) (Oral)  Resp 16  SpO2 99%  LMP 11/08/2015 Physical Exam  Constitutional: She is oriented to person, place, and time. She appears well-developed and well-nourished.  Non-toxic appearance. No distress.  HENT:  Head: Normocephalic and atraumatic.  Right Ear: Tympanic membrane and external ear  normal.  Left Ear: Tympanic membrane and external ear normal.  Nose: No rhinorrhea or sinus tenderness. Right sinus exhibits maxillary sinus tenderness and frontal sinus tenderness. Left sinus exhibits maxillary sinus tenderness and frontal sinus tenderness.  Mouth/Throat: Oropharynx is clear and moist. No oropharyngeal exudate.  Eyes: EOM are normal. Pupils are equal, round, and reactive to light.   Neck: Normal range of motion. Neck supple.  Cardiovascular: Regular rhythm, normal heart sounds and intact distal pulses.  Tachycardia present.   No murmur heard. Pulmonary/Chest: Effort normal. No respiratory distress. She has no wheezes. She has no rales.  Abdominal: Soft. She exhibits no distension. There is no tenderness.  Musculoskeletal: Normal range of motion. She exhibits no edema or tenderness.  Neurological: She is alert and oriented to person, place, and time.  Skin: Skin is warm and dry. No rash noted. She is not diaphoretic.  Vitals reviewed.   ED Course  Procedures (including critical care time) Labs Review Labs Reviewed  CBC - Abnormal; Notable for the following:    WBC 13.3 (*)    All other components within normal limits  COMPREHENSIVE METABOLIC PANEL - Abnormal; Notable for the following:    CO2 20 (*)    Glucose, Bld 115 (*)    Calcium 8.6 (*)    All other components within normal limits  I-STAT BETA HCG BLOOD, ED ([facility], [facility], [facility] ONLY)    Imaging Review Dg Chest 2 View  11/11/2015  CLINICAL DATA:  Wheezing. EXAM: CHEST  2 VIEW COMPARISON:  July 23, 2011 FINDINGS: The heart size and mediastinal contours are within normal limits. Both lungs are clear. The visualized skeletal structures are unremarkable. IMPRESSION: No active cardiopulmonary disease. Electronically Signed   By: Dorise Bullion III M.D   On: 11/11/2015 14:23   I have personally reviewed and evaluated these images and lab results as part of my medical decision-making.   EKG Interpretation None      MDM  Patient was seen and evaluated in stable condition. Mild tachycardia on examination.  No wheezing appreciated on examination lungs sounds clear. Laboratory studies with leukocytosis and bicarbonate of 20 but otherwise unremarkable. Chest x-ray unremarkable. Patient was given IV fluids, Toradol, breathing treatment with great improvement in her symptoms.  Discussed all results and clinical  impression with patient. She expressed understanding and agreement. She was discharged home with a prescription for Augmentin. She was instructed to keep herself well-hydrated. She reported having an albuterol inhaler at home and not needing a refill. She was given a dose of Decadron. Final diagnoses:  Acute sinusitis, recurrence not specified, unspecified location  Upper respiratory infection    1. Sinusitis 2. Upper respiratory infection    Harvel Quale, MD 11/11/15 1723

## 2016-03-27 DIAGNOSIS — K080 Exfoliation of teeth due to systemic causes: Secondary | ICD-10-CM | POA: Diagnosis not present

## 2016-04-02 DIAGNOSIS — I10 Essential (primary) hypertension: Secondary | ICD-10-CM | POA: Diagnosis not present

## 2016-04-02 DIAGNOSIS — B370 Candidal stomatitis: Secondary | ICD-10-CM | POA: Diagnosis not present

## 2016-04-02 DIAGNOSIS — J029 Acute pharyngitis, unspecified: Secondary | ICD-10-CM | POA: Diagnosis not present

## 2016-04-25 ENCOUNTER — Other Ambulatory Visit: Payer: Self-pay | Admitting: Family Medicine

## 2016-04-25 DIAGNOSIS — Z1231 Encounter for screening mammogram for malignant neoplasm of breast: Secondary | ICD-10-CM

## 2016-05-27 DIAGNOSIS — J45901 Unspecified asthma with (acute) exacerbation: Secondary | ICD-10-CM | POA: Diagnosis not present

## 2016-05-27 DIAGNOSIS — J45909 Unspecified asthma, uncomplicated: Secondary | ICD-10-CM | POA: Diagnosis not present

## 2016-05-30 ENCOUNTER — Ambulatory Visit (INDEPENDENT_AMBULATORY_CARE_PROVIDER_SITE_OTHER): Payer: Federal, State, Local not specified - PPO

## 2016-05-30 DIAGNOSIS — Z1231 Encounter for screening mammogram for malignant neoplasm of breast: Secondary | ICD-10-CM

## 2016-06-27 DIAGNOSIS — I10 Essential (primary) hypertension: Secondary | ICD-10-CM | POA: Diagnosis not present

## 2016-06-27 DIAGNOSIS — Z6841 Body Mass Index (BMI) 40.0 and over, adult: Secondary | ICD-10-CM | POA: Diagnosis not present

## 2016-06-27 DIAGNOSIS — Z0000 Encounter for general adult medical examination without abnormal findings: Secondary | ICD-10-CM | POA: Diagnosis not present

## 2016-06-27 DIAGNOSIS — J45909 Unspecified asthma, uncomplicated: Secondary | ICD-10-CM | POA: Diagnosis not present

## 2016-06-27 DIAGNOSIS — Z13220 Encounter for screening for lipoid disorders: Secondary | ICD-10-CM | POA: Diagnosis not present

## 2016-07-31 DIAGNOSIS — E538 Deficiency of other specified B group vitamins: Secondary | ICD-10-CM | POA: Diagnosis not present

## 2016-07-31 DIAGNOSIS — M545 Low back pain: Secondary | ICD-10-CM | POA: Diagnosis not present

## 2016-07-31 DIAGNOSIS — E559 Vitamin D deficiency, unspecified: Secondary | ICD-10-CM | POA: Diagnosis not present

## 2016-07-31 DIAGNOSIS — E119 Type 2 diabetes mellitus without complications: Secondary | ICD-10-CM | POA: Diagnosis not present

## 2016-07-31 DIAGNOSIS — E531 Pyridoxine deficiency: Secondary | ICD-10-CM | POA: Diagnosis not present

## 2016-07-31 DIAGNOSIS — G603 Idiopathic progressive neuropathy: Secondary | ICD-10-CM | POA: Diagnosis not present

## 2016-08-04 DIAGNOSIS — E669 Obesity, unspecified: Secondary | ICD-10-CM | POA: Diagnosis not present

## 2016-08-08 DIAGNOSIS — M545 Low back pain: Secondary | ICD-10-CM | POA: Diagnosis not present

## 2016-08-08 DIAGNOSIS — E559 Vitamin D deficiency, unspecified: Secondary | ICD-10-CM | POA: Diagnosis not present

## 2016-08-08 DIAGNOSIS — G603 Idiopathic progressive neuropathy: Secondary | ICD-10-CM | POA: Diagnosis not present

## 2016-08-08 DIAGNOSIS — H47323 Drusen of optic disc, bilateral: Secondary | ICD-10-CM | POA: Diagnosis not present

## 2016-09-22 DIAGNOSIS — K080 Exfoliation of teeth due to systemic causes: Secondary | ICD-10-CM | POA: Diagnosis not present

## 2016-10-03 DIAGNOSIS — E669 Obesity, unspecified: Secondary | ICD-10-CM | POA: Diagnosis not present

## 2017-01-02 DIAGNOSIS — M545 Low back pain: Secondary | ICD-10-CM | POA: Diagnosis not present

## 2017-01-02 DIAGNOSIS — G589 Mononeuropathy, unspecified: Secondary | ICD-10-CM | POA: Diagnosis not present

## 2017-01-02 DIAGNOSIS — E559 Vitamin D deficiency, unspecified: Secondary | ICD-10-CM | POA: Diagnosis not present

## 2017-01-02 DIAGNOSIS — G603 Idiopathic progressive neuropathy: Secondary | ICD-10-CM | POA: Diagnosis not present

## 2017-01-02 DIAGNOSIS — R51 Headache: Secondary | ICD-10-CM | POA: Diagnosis not present

## 2017-04-06 DIAGNOSIS — K080 Exfoliation of teeth due to systemic causes: Secondary | ICD-10-CM | POA: Diagnosis not present

## 2017-06-08 DIAGNOSIS — J209 Acute bronchitis, unspecified: Secondary | ICD-10-CM | POA: Diagnosis not present

## 2017-06-08 DIAGNOSIS — R062 Wheezing: Secondary | ICD-10-CM | POA: Diagnosis not present

## 2017-06-08 DIAGNOSIS — R05 Cough: Secondary | ICD-10-CM | POA: Diagnosis not present

## 2017-06-08 DIAGNOSIS — J45909 Unspecified asthma, uncomplicated: Secondary | ICD-10-CM | POA: Diagnosis not present

## 2017-06-11 DIAGNOSIS — G603 Idiopathic progressive neuropathy: Secondary | ICD-10-CM | POA: Diagnosis not present

## 2017-06-11 DIAGNOSIS — M792 Neuralgia and neuritis, unspecified: Secondary | ICD-10-CM | POA: Diagnosis not present

## 2017-06-11 DIAGNOSIS — M545 Low back pain: Secondary | ICD-10-CM | POA: Diagnosis not present

## 2017-06-11 DIAGNOSIS — M79672 Pain in left foot: Secondary | ICD-10-CM | POA: Diagnosis not present

## 2017-07-03 DIAGNOSIS — I10 Essential (primary) hypertension: Secondary | ICD-10-CM | POA: Diagnosis not present

## 2017-07-03 DIAGNOSIS — Z6841 Body Mass Index (BMI) 40.0 and over, adult: Secondary | ICD-10-CM | POA: Diagnosis not present

## 2017-07-03 DIAGNOSIS — Z13220 Encounter for screening for lipoid disorders: Secondary | ICD-10-CM | POA: Diagnosis not present

## 2017-07-03 DIAGNOSIS — J45909 Unspecified asthma, uncomplicated: Secondary | ICD-10-CM | POA: Diagnosis not present

## 2017-07-03 DIAGNOSIS — Z124 Encounter for screening for malignant neoplasm of cervix: Secondary | ICD-10-CM | POA: Diagnosis not present

## 2017-07-03 DIAGNOSIS — Z131 Encounter for screening for diabetes mellitus: Secondary | ICD-10-CM | POA: Diagnosis not present

## 2017-07-03 DIAGNOSIS — Z0000 Encounter for general adult medical examination without abnormal findings: Secondary | ICD-10-CM | POA: Diagnosis not present

## 2017-07-10 DIAGNOSIS — M21172 Varus deformity, not elsewhere classified, left ankle: Secondary | ICD-10-CM | POA: Diagnosis not present

## 2017-07-10 DIAGNOSIS — M7752 Other enthesopathy of left foot: Secondary | ICD-10-CM | POA: Diagnosis not present

## 2017-07-10 DIAGNOSIS — M25572 Pain in left ankle and joints of left foot: Secondary | ICD-10-CM | POA: Diagnosis not present

## 2017-07-10 DIAGNOSIS — M7742 Metatarsalgia, left foot: Secondary | ICD-10-CM | POA: Diagnosis not present

## 2017-08-21 DIAGNOSIS — M25572 Pain in left ankle and joints of left foot: Secondary | ICD-10-CM | POA: Diagnosis not present

## 2017-08-21 DIAGNOSIS — M7752 Other enthesopathy of left foot: Secondary | ICD-10-CM | POA: Diagnosis not present

## 2017-08-21 DIAGNOSIS — M21172 Varus deformity, not elsewhere classified, left ankle: Secondary | ICD-10-CM | POA: Diagnosis not present

## 2017-08-21 DIAGNOSIS — M12572 Traumatic arthropathy, left ankle and foot: Secondary | ICD-10-CM | POA: Diagnosis not present

## 2017-09-04 DIAGNOSIS — M79672 Pain in left foot: Secondary | ICD-10-CM | POA: Diagnosis not present

## 2017-09-04 DIAGNOSIS — G603 Idiopathic progressive neuropathy: Secondary | ICD-10-CM | POA: Diagnosis not present

## 2017-09-04 DIAGNOSIS — E559 Vitamin D deficiency, unspecified: Secondary | ICD-10-CM | POA: Diagnosis not present

## 2017-09-04 DIAGNOSIS — R51 Headache: Secondary | ICD-10-CM | POA: Diagnosis not present

## 2017-09-09 DIAGNOSIS — H524 Presbyopia: Secondary | ICD-10-CM | POA: Diagnosis not present

## 2017-09-09 DIAGNOSIS — H47323 Drusen of optic disc, bilateral: Secondary | ICD-10-CM | POA: Diagnosis not present

## 2018-01-22 DIAGNOSIS — M792 Neuralgia and neuritis, unspecified: Secondary | ICD-10-CM | POA: Diagnosis not present

## 2018-01-22 DIAGNOSIS — E559 Vitamin D deficiency, unspecified: Secondary | ICD-10-CM | POA: Diagnosis not present

## 2018-01-22 DIAGNOSIS — G603 Idiopathic progressive neuropathy: Secondary | ICD-10-CM | POA: Diagnosis not present

## 2018-01-22 DIAGNOSIS — M545 Low back pain: Secondary | ICD-10-CM | POA: Diagnosis not present

## 2018-03-09 DIAGNOSIS — K080 Exfoliation of teeth due to systemic causes: Secondary | ICD-10-CM | POA: Diagnosis not present

## 2018-04-19 ENCOUNTER — Other Ambulatory Visit: Payer: Self-pay | Admitting: Family Medicine

## 2018-04-19 DIAGNOSIS — Z1231 Encounter for screening mammogram for malignant neoplasm of breast: Secondary | ICD-10-CM

## 2018-04-30 ENCOUNTER — Ambulatory Visit: Payer: Federal, State, Local not specified - PPO

## 2018-04-30 ENCOUNTER — Ambulatory Visit (INDEPENDENT_AMBULATORY_CARE_PROVIDER_SITE_OTHER): Payer: Federal, State, Local not specified - PPO

## 2018-04-30 DIAGNOSIS — Z1231 Encounter for screening mammogram for malignant neoplasm of breast: Secondary | ICD-10-CM | POA: Diagnosis not present

## 2018-05-13 DIAGNOSIS — J45909 Unspecified asthma, uncomplicated: Secondary | ICD-10-CM | POA: Diagnosis not present

## 2018-05-28 DIAGNOSIS — M79604 Pain in right leg: Secondary | ICD-10-CM | POA: Diagnosis not present

## 2018-05-28 DIAGNOSIS — M792 Neuralgia and neuritis, unspecified: Secondary | ICD-10-CM | POA: Diagnosis not present

## 2018-05-28 DIAGNOSIS — Z79899 Other long term (current) drug therapy: Secondary | ICD-10-CM | POA: Diagnosis not present

## 2018-05-28 DIAGNOSIS — I10 Essential (primary) hypertension: Secondary | ICD-10-CM | POA: Diagnosis not present

## 2018-05-28 DIAGNOSIS — M545 Low back pain: Secondary | ICD-10-CM | POA: Diagnosis not present

## 2018-05-28 DIAGNOSIS — E559 Vitamin D deficiency, unspecified: Secondary | ICD-10-CM | POA: Diagnosis not present

## 2018-05-28 DIAGNOSIS — G603 Idiopathic progressive neuropathy: Secondary | ICD-10-CM | POA: Diagnosis not present

## 2018-05-28 DIAGNOSIS — F411 Generalized anxiety disorder: Secondary | ICD-10-CM | POA: Diagnosis not present

## 2018-06-01 DIAGNOSIS — I10 Essential (primary) hypertension: Secondary | ICD-10-CM | POA: Diagnosis not present

## 2018-06-07 DIAGNOSIS — I10 Essential (primary) hypertension: Secondary | ICD-10-CM | POA: Diagnosis not present

## 2018-07-09 DIAGNOSIS — Z13220 Encounter for screening for lipoid disorders: Secondary | ICD-10-CM | POA: Diagnosis not present

## 2018-07-09 DIAGNOSIS — I10 Essential (primary) hypertension: Secondary | ICD-10-CM | POA: Diagnosis not present

## 2018-07-09 DIAGNOSIS — Z0000 Encounter for general adult medical examination without abnormal findings: Secondary | ICD-10-CM | POA: Diagnosis not present

## 2018-08-24 DIAGNOSIS — M26602 Left temporomandibular joint disorder, unspecified: Secondary | ICD-10-CM | POA: Diagnosis not present

## 2018-08-30 DIAGNOSIS — I10 Essential (primary) hypertension: Secondary | ICD-10-CM | POA: Diagnosis not present

## 2018-08-30 DIAGNOSIS — H10023 Other mucopurulent conjunctivitis, bilateral: Secondary | ICD-10-CM | POA: Diagnosis not present

## 2018-09-06 DIAGNOSIS — H109 Unspecified conjunctivitis: Secondary | ICD-10-CM | POA: Diagnosis not present

## 2018-12-03 DIAGNOSIS — M79604 Pain in right leg: Secondary | ICD-10-CM | POA: Diagnosis not present

## 2018-12-03 DIAGNOSIS — Z79899 Other long term (current) drug therapy: Secondary | ICD-10-CM | POA: Diagnosis not present

## 2018-12-03 DIAGNOSIS — R51 Headache: Secondary | ICD-10-CM | POA: Diagnosis not present

## 2018-12-03 DIAGNOSIS — E559 Vitamin D deficiency, unspecified: Secondary | ICD-10-CM | POA: Diagnosis not present

## 2018-12-03 DIAGNOSIS — G603 Idiopathic progressive neuropathy: Secondary | ICD-10-CM | POA: Diagnosis not present

## 2019-01-07 DIAGNOSIS — I10 Essential (primary) hypertension: Secondary | ICD-10-CM | POA: Diagnosis not present

## 2019-01-07 DIAGNOSIS — G479 Sleep disorder, unspecified: Secondary | ICD-10-CM | POA: Diagnosis not present

## 2019-01-07 DIAGNOSIS — Z13220 Encounter for screening for lipoid disorders: Secondary | ICD-10-CM | POA: Diagnosis not present

## 2019-01-07 DIAGNOSIS — Z6841 Body Mass Index (BMI) 40.0 and over, adult: Secondary | ICD-10-CM | POA: Diagnosis not present

## 2019-01-07 DIAGNOSIS — N926 Irregular menstruation, unspecified: Secondary | ICD-10-CM | POA: Diagnosis not present

## 2019-01-10 ENCOUNTER — Other Ambulatory Visit: Payer: Self-pay | Admitting: Physician Assistant

## 2019-01-10 DIAGNOSIS — N926 Irregular menstruation, unspecified: Secondary | ICD-10-CM

## 2019-01-11 ENCOUNTER — Ambulatory Visit (INDEPENDENT_AMBULATORY_CARE_PROVIDER_SITE_OTHER): Payer: Federal, State, Local not specified - PPO

## 2019-01-11 ENCOUNTER — Other Ambulatory Visit: Payer: Self-pay

## 2019-01-11 DIAGNOSIS — N939 Abnormal uterine and vaginal bleeding, unspecified: Secondary | ICD-10-CM | POA: Diagnosis not present

## 2019-01-11 DIAGNOSIS — N926 Irregular menstruation, unspecified: Secondary | ICD-10-CM | POA: Diagnosis not present

## 2019-01-24 DIAGNOSIS — H524 Presbyopia: Secondary | ICD-10-CM | POA: Diagnosis not present

## 2019-01-24 DIAGNOSIS — H47323 Drusen of optic disc, bilateral: Secondary | ICD-10-CM | POA: Diagnosis not present

## 2019-03-18 DIAGNOSIS — R102 Pelvic and perineal pain: Secondary | ICD-10-CM | POA: Diagnosis not present

## 2019-03-18 DIAGNOSIS — N921 Excessive and frequent menstruation with irregular cycle: Secondary | ICD-10-CM | POA: Diagnosis not present

## 2019-03-18 DIAGNOSIS — C541 Malignant neoplasm of endometrium: Secondary | ICD-10-CM | POA: Diagnosis not present

## 2019-04-01 DIAGNOSIS — C541 Malignant neoplasm of endometrium: Secondary | ICD-10-CM | POA: Diagnosis not present

## 2019-04-04 ENCOUNTER — Telehealth: Payer: Self-pay | Admitting: *Deleted

## 2019-04-04 NOTE — Telephone Encounter (Signed)
Called and left the patient a message to call the office back. Patient needs to be scheduled for a new patient appt  °

## 2019-04-04 NOTE — Telephone Encounter (Signed)
Patient called back and was scheduled for 10/19

## 2019-04-11 ENCOUNTER — Encounter: Payer: Self-pay | Admitting: Gynecologic Oncology

## 2019-04-11 ENCOUNTER — Other Ambulatory Visit: Payer: Self-pay

## 2019-04-11 ENCOUNTER — Other Ambulatory Visit: Payer: Self-pay | Admitting: Gynecologic Oncology

## 2019-04-11 ENCOUNTER — Inpatient Hospital Stay: Payer: Federal, State, Local not specified - PPO | Attending: Gynecologic Oncology | Admitting: Gynecologic Oncology

## 2019-04-11 VITALS — BP 133/66 | HR 91 | Temp 98.3°F | Resp 18 | Ht 66.5 in | Wt 282.0 lb

## 2019-04-11 DIAGNOSIS — B009 Herpesviral infection, unspecified: Secondary | ICD-10-CM | POA: Insufficient documentation

## 2019-04-11 DIAGNOSIS — Z7951 Long term (current) use of inhaled steroids: Secondary | ICD-10-CM | POA: Insufficient documentation

## 2019-04-11 DIAGNOSIS — F329 Major depressive disorder, single episode, unspecified: Secondary | ICD-10-CM | POA: Insufficient documentation

## 2019-04-11 DIAGNOSIS — I10 Essential (primary) hypertension: Secondary | ICD-10-CM | POA: Insufficient documentation

## 2019-04-11 DIAGNOSIS — Z6841 Body Mass Index (BMI) 40.0 and over, adult: Secondary | ICD-10-CM | POA: Diagnosis not present

## 2019-04-11 DIAGNOSIS — C541 Malignant neoplasm of endometrium: Secondary | ICD-10-CM | POA: Diagnosis not present

## 2019-04-11 DIAGNOSIS — J45909 Unspecified asthma, uncomplicated: Secondary | ICD-10-CM | POA: Insufficient documentation

## 2019-04-11 DIAGNOSIS — Z79899 Other long term (current) drug therapy: Secondary | ICD-10-CM | POA: Insufficient documentation

## 2019-04-11 MED ORDER — OXYCODONE HCL 5 MG PO TABS: 5.0000 mg | ORAL_TABLET | ORAL | 0 refills | Status: DC | PRN

## 2019-04-11 MED ORDER — SENNOSIDES-DOCUSATE SODIUM 8.6-50 MG PO TABS: 2.0000 | ORAL_TABLET | Freq: Every day | ORAL | 1 refills | Status: DC

## 2019-04-11 NOTE — H&P (View-Only) (Signed)
Consult Note: Gyn-Onc  Consult was requested by Dr. Landry Mellow for the evaluation of Gabriela Alexander 48 y.o. female  CC:  Chief Complaint  Patient presents with  . Endometrial ca [facility])    Assessment/Plan:  [name]  is a 48 y.o.  year old with grade 1 endometrioid endometrial adenocarcinoma and morbid obesity.  A detailed discussion was held with the patient and her family with regard to to her endometrial cancer diagnosis. We discussed the standard management options for uterine cancer which includes surgery followed possibly by adjuvant therapy depending on the results of surgery. The options for surgical management include a hysterectomy and removal of the tubes and ovaries possibly with removal of pelvic and para-aortic lymph nodes.If feasible, a minimally invasive approach including a robotic hysterectomy or laparoscopic hysterectomy have benefits including shorter hospital stay, recovery time and better wound healing than with open surgery. The patient has been counseled about these surgical options and the risks of surgery in general including infection, bleeding, damage to surrounding structures (including bowel, bladder, ureters, nerves or vessels), and the postoperative risks of PE/ DVT, and lymphedema. I extensively reviewed the additional risks of robotic hysterectomy including possible need for conversion to open laparotomy.  I discussed positioning during surgery of trendelenberg and risks of minor facial swelling and care we take in preoperative positioning.  After counseling and consideration of her options, she desires to proceed with robotic assisted total hysterectomy with bilateral sapingo-oophorectomy and SLN biopsy.   She will be seen by anesthesia for preoperative clearance and discussion of postoperative pain management.  She was given the opportunity to ask questions, which were answered to her satisfaction, and she is agreement with the above mentioned plan of care.  HPI: Ms  Gabriela Alexander is a 48 year old P2 who is seen in consultation at the request of Dr Landry Mellow for FIGO grade 1 endometrioid endometrial adenocarcinoma in the setting of morbid obesity.   The patient reported irregular menstrual periods for approximately 3 years.  These would include frequently spaced menses as well as periods of oligomenorrhea including 18-week spans of no menstrual bleeding.  She mentioned this to her primary care physician who ordered a transvaginal ultrasound on January 11, 2019.  This revealed a uterus measuring 11.8 x 8 x 4.1 cm with an endometrial thickness of 15 mm.  The right ovary was not visualized due to body habitus and the left ovary measured 2.2 x 1.3 x 1.7 cm with hypoechoic area seen which may represent an ovarian cyst.  Due to the upper limit of normal endometrium and her abnormal uterine bleeding symptoms she was referred to Dr. Landry Mellow.  Dr. Landry Mellow saw the patient on March 18, 2019 and performed an endometrial biopsy at that visit which revealed FIGO grade 1 endometrial adenocarcinoma with extensive complex atypical hyperplasia.  .  The patient otherwise has a medical history significant for morbid obesity.  Her BMI is 45 kg meters squared.  She has hypertension and asthma.  She denies a history of diabetes mellitus.  She takes amitriptyline for a left foot injury with associated numbness on the sole of her left foot.  She has a remote history of pseudotumor cerebri, however has no headaches associated with this.  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  Her prior abdominal surgeries include 2 prior cesarean sections 1 which included a tubal ligation.  Additionally she had the tumor from the  lateral side of her head.  She has had no vaginal births.  She had no history of abnormal Pap smears and her last normal Pap smear was in January 2019 was normal.  She has no family history of malignancy.  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  She lives with her husband and her adult daughter.  Current Meds:  Outpatient Encounter Medications as of 04/11/2019  Medication Sig  . albuterol (PROVENTIL HFA;VENTOLIN HFA) 108 (90 Base) MCG/ACT inhaler Inhale 1-2 puffs into the lungs every 6 (six) hours as needed for wheezing or shortness of breath.  Marland Kitchen amitriptyline (ELAVIL) 25 MG tablet Take 25 mg by mouth 3 (three) times daily. For shooting leg pain.  . Bacillus Coagulans-Inulin (PROBIOTIC) 1-250 BILLION-MG CAPS Take by mouth 1 day or 1 dose.  . chlorthalidone (HYGROTON) 25 MG tablet Take 25 mg by mouth daily.  . DULERA 100-5 MCG/ACT AERO Inhale 2 puffs into the lungs 2 (two) times daily.   . ergocalciferol (VITAMIN D2) 1.25 MG (50000 UT) capsule Take 50,000 Units by mouth once a week.  . Ferrous Sulfate (SLOW FE PO) Take by mouth as needed.  Marland Kitchen ibuprofen (ADVIL) 200 MG tablet Take 400 mg by mouth every 6 (six) hours as needed for moderate pain.  . megestrol (MEGACE) 40 MG tablet Take 40 mg by mouth daily.  . montelukast (SINGULAIR) 10 MG tablet Take 10 mg by mouth daily.   . verapamil (VERELAN PM) 240 MG 24 hr capsule Take 240 mg by mouth at bedtime.  . [DISCONTINUED] amoxicillin-clavulanate (AUGMENTIN) 875-125 MG tablet Take 1 tablet by mouth every 12 (twelve) hours.  . [DISCONTINUED] ofloxacin (OCUFLOX) 0.3 % ophthalmic solution 1 drop into affected eye four times a day.   No facility-administered encounter medications on file as of 04/11/2019.     Allergy:  Allergies  Allergen Reactions  . Prednisone Other (See Comments)    "feels like a raving lunatic...angry"  . Latex Rash    Hot to the touch.     Social Hx:   Social History   Socioeconomic History  . Marital status: Married    Spouse name: Not on file  . Number of children: Not on file  . Years of education: Not on file  . Highest education level: Not on file  Occupational History  . Not on file   Social Needs  . Financial resource strain: Not on file  . Food insecurity    Worry: Not on file    Inability: Not on file  . Transportation needs    Medical: Not on file    Non-medical: Not on file  Tobacco Use  . Smoking status: Never Smoker  . Smokeless tobacco: Never Used  Substance and Sexual Activity  . Alcohol use: No  . Drug use: Never  . Sexual activity: Yes  Lifestyle  . Physical activity    Days per week: Not on file    Minutes per session: Not on file  . Stress: Not on file  Relationships  . Social Herbalist on phone: Not on file    Gets together: Not on file    Attends religious service: Not on file    Active member of club or organization: Not on file    Attends meetings of clubs or organizations: Not on file    Relationship status: Not on file  . Intimate partner violence    Fear of current or ex  partner: Not on file    Emotionally abused: Not on file    Physically abused: Not on file    Forced sexual activity: Not on file  Other Topics Concern  . Not on file  Social History Narrative  . Not on file    Past Surgical Hx:  Past Surgical History:  Procedure Laterality Date  . CESAREAN SECTION    . genital herpes N/A   . LAPAROSCOPIC TUBAL LIGATION Bilateral   . OTHER SURGICAL HISTORY Right    Benign tumor removed from right side of head at age 34    Past Medical Hx:  Past Medical History:  Diagnosis Date  . Allergic rhinitis   . Asthma   . Depression   . Genital herpes   . Hypertension   . Insomnia   . Migraine   . Neuropathy   . Pseudotumor cerebri     Past Gynecological History:  C/s x2  No LMP recorded. (Menstrual status: Perimenopausal).  Family Hx:  Family History  Problem Relation Age of Onset  . High Cholesterol Mother   . Prostate cancer Father   . High Cholesterol Father   . Arthritis Father   . Coronary artery disease Maternal Grandfather   . Stroke Paternal Grandmother   . Macular degeneration Maternal  Grandmother   . Ovarian cancer Neg Hx   . Endometrial cancer Neg Hx     Review of Systems:  Constitutional  Feels well,    ENT Normal appearing ears and nares bilaterally Skin/Breast  No rash, sores, jaundice, itching, dryness Cardiovascular  No chest pain, shortness of breath, or edema  Pulmonary  No cough or wheeze.  Gastro Intestinal  No nausea, vomitting, or diarrhoea. No bright red blood per rectum, no abdominal pain, change in bowel movement, or constipation.  Genito Urinary  No frequency, urgency, dysuria, + abnormal uterine bleeding Musculo Skeletal  No myalgia, arthralgia, joint swelling or pain  Neurologic  No weakness, numbness, change in gait,  Psychology  No depression, anxiety, insomnia.   Vitals:  Blood pressure 133/66, pulse 91, temperature 98.3 F (36.8 C), temperature source Temporal, resp. rate 18, height 5' 6.5" (1.689 m), weight 282 lb (127.9 kg).  Physical Exam: WD in NAD Neck  Supple NROM, without any enlargements.  Lymph Node Survey No cervical supraclavicular or inguinal adenopathy Cardiovascular  Pulse normal rate, regularity and rhythm. S1 and S2 normal.  Lungs  Clear to auscultation bilateraly, without wheezes/crackles/rhonchi. Good air movement.  Skin  No rash/lesions/breakdown  Psychiatry  Alert and oriented to person, place, and time  Abdomen  Normoactive bowel sounds, abdomen soft, non-tender and obese with palpable 1cm umbilical hernia.  Back No CVA tenderness Genito Urinary  Vulva/vagina: Normal external female genitalia.   No lesions. No discharge or bleeding.  Bladder/urethra:  No lesions or masses, well supported bladder  Vagina: normal  Cervix: Normal appearing, no lesions.  Uterus: Does not feel enlarged, mobile, no parametrial involvement or nodularity.  Adnexa: no palpable masses. Rectal  deferred Extremities  No bilateral cyanosis, clubbing or edema.   Thereasa Solo, MD  04/11/2019, 11:10 AM

## 2019-04-11 NOTE — Progress Notes (Signed)
Consult Note: Gyn-Onc  Consult was requested by Dr. Landry Mellow for the evaluation of Gabriela Alexander 48 y.o. female  CC:  Chief Complaint  Patient presents with  . Endometrial ca [facility])    Assessment/Plan:  Ms. Breklynn Wollam  is a 48 y.o.  year old with grade 1 endometrioid endometrial adenocarcinoma and morbid obesity.  A detailed discussion was held with the patient and her family with regard to to her endometrial cancer diagnosis. We discussed the standard management options for uterine cancer which includes surgery followed possibly by adjuvant therapy depending on the results of surgery. The options for surgical management include a hysterectomy and removal of the tubes and ovaries possibly with removal of pelvic and para-aortic lymph nodes.If feasible, a minimally invasive approach including a robotic hysterectomy or laparoscopic hysterectomy have benefits including shorter hospital stay, recovery time and better wound healing than with open surgery. The patient has been counseled about these surgical options and the risks of surgery in general including infection, bleeding, damage to surrounding structures (including bowel, bladder, ureters, nerves or vessels), and the postoperative risks of PE/ DVT, and lymphedema. I extensively reviewed the additional risks of robotic hysterectomy including possible need for conversion to open laparotomy.  I discussed positioning during surgery of trendelenberg and risks of minor facial swelling and care we take in preoperative positioning.  After counseling and consideration of her options, she desires to proceed with robotic assisted total hysterectomy with bilateral sapingo-oophorectomy and SLN biopsy.   She will be seen by anesthesia for preoperative clearance and discussion of postoperative pain management.  She was given the opportunity to ask questions, which were answered to her satisfaction, and she is agreement with the above mentioned plan of care.  HPI: Ms  Gabriela Alexander is a 48 year old P2 who is seen in consultation at the request of Dr Landry Mellow for FIGO grade 1 endometrioid endometrial adenocarcinoma in the setting of morbid obesity.   The patient reported irregular menstrual periods for approximately 3 years.  These would include frequently spaced menses as well as periods of oligomenorrhea including 18-week spans of no menstrual bleeding.  She mentioned this to her primary care physician who ordered a transvaginal ultrasound on January 11, 2019.  This revealed a uterus measuring 11.8 x 8 x 4.1 cm with an endometrial thickness of 15 mm.  The right ovary was not visualized due to body habitus and the left ovary measured 2.2 x 1.3 x 1.7 cm with hypoechoic area seen which may represent an ovarian cyst.  Due to the upper limit of normal endometrium and her abnormal uterine bleeding symptoms she was referred to Dr. Landry Mellow.  Dr. Landry Mellow saw the patient on March 18, 2019 and performed an endometrial biopsy at that visit which revealed FIGO grade 1 endometrial adenocarcinoma with extensive complex atypical hyperplasia.  .  The patient otherwise has a medical history significant for morbid obesity.  Her BMI is 45 kg meters squared.  She has hypertension and asthma.  She denies a history of diabetes mellitus.  She takes amitriptyline for a left foot injury with associated numbness on the sole of her left foot.  She has a remote history of pseudotumor cerebri, however has no headaches associated with this.  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  Her prior abdominal surgeries include 2 prior cesarean sections 1 which included a tubal ligation.  Additionally she had the tumor from the  lateral side of her head.  She has had no vaginal births.  She had no history of abnormal Pap smears and her last normal Pap smear was in January 2019 was normal.  She has no family history of malignancy.  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  She lives with her husband and her adult daughter.  Current Meds:  Outpatient Encounter Medications as of 04/11/2019  Medication Sig  . albuterol (PROVENTIL HFA;VENTOLIN HFA) 108 (90 Base) MCG/ACT inhaler Inhale 1-2 puffs into the lungs every 6 (six) hours as needed for wheezing or shortness of breath.  Marland Kitchen amitriptyline (ELAVIL) 25 MG tablet Take 25 mg by mouth 3 (three) times daily. For shooting leg pain.  . Bacillus Coagulans-Inulin (PROBIOTIC) 1-250 BILLION-MG CAPS Take by mouth 1 day or 1 dose.  . chlorthalidone (HYGROTON) 25 MG tablet Take 25 mg by mouth daily.  . DULERA 100-5 MCG/ACT AERO Inhale 2 puffs into the lungs 2 (two) times daily.   . ergocalciferol (VITAMIN D2) 1.25 MG (50000 UT) capsule Take 50,000 Units by mouth once a week.  . Ferrous Sulfate (SLOW FE PO) Take by mouth as needed.  Marland Kitchen ibuprofen (ADVIL) 200 MG tablet Take 400 mg by mouth every 6 (six) hours as needed for moderate pain.  . megestrol (MEGACE) 40 MG tablet Take 40 mg by mouth daily.  . montelukast (SINGULAIR) 10 MG tablet Take 10 mg by mouth daily.   . verapamil (VERELAN PM) 240 MG 24 hr capsule Take 240 mg by mouth at bedtime.  . [DISCONTINUED] amoxicillin-clavulanate (AUGMENTIN) 875-125 MG tablet Take 1 tablet by mouth every 12 (twelve) hours.  . [DISCONTINUED] ofloxacin (OCUFLOX) 0.3 % ophthalmic solution 1 drop into affected eye four times a day.   No facility-administered encounter medications on file as of 04/11/2019.     Allergy:  Allergies  Allergen Reactions  . Prednisone Other (See Comments)    "feels like a raving lunatic...angry"  . Latex Rash    Hot to the touch.     Social Hx:   Social History   Socioeconomic History  . Marital status: Married    Spouse name: Not on file  . Number of children: Not on file  . Years of education: Not on file  . Highest education level: Not on file  Occupational History  . Not on file   Social Needs  . Financial resource strain: Not on file  . Food insecurity    Worry: Not on file    Inability: Not on file  . Transportation needs    Medical: Not on file    Non-medical: Not on file  Tobacco Use  . Smoking status: Never Smoker  . Smokeless tobacco: Never Used  Substance and Sexual Activity  . Alcohol use: No  . Drug use: Never  . Sexual activity: Yes  Lifestyle  . Physical activity    Days per week: Not on file    Minutes per session: Not on file  . Stress: Not on file  Relationships  . Social Herbalist on phone: Not on file    Gets together: Not on file    Attends religious service: Not on file    Active member of club or organization: Not on file    Attends meetings of clubs or organizations: Not on file    Relationship status: Not on file  . Intimate partner violence    Fear of current or ex  partner: Not on file    Emotionally abused: Not on file    Physically abused: Not on file    Forced sexual activity: Not on file  Other Topics Concern  . Not on file  Social History Narrative  . Not on file    Past Surgical Hx:  Past Surgical History:  Procedure Laterality Date  . CESAREAN SECTION    . genital herpes N/A   . LAPAROSCOPIC TUBAL LIGATION Bilateral   . OTHER SURGICAL HISTORY Right    Benign tumor removed from right side of head at age 39    Past Medical Hx:  Past Medical History:  Diagnosis Date  . Allergic rhinitis   . Asthma   . Depression   . Genital herpes   . Hypertension   . Insomnia   . Migraine   . Neuropathy   . Pseudotumor cerebri     Past Gynecological History:  C/s x2  No LMP recorded. (Menstrual status: Perimenopausal).  Family Hx:  Family History  Problem Relation Age of Onset  . High Cholesterol Mother   . Prostate cancer Father   . High Cholesterol Father   . Arthritis Father   . Coronary artery disease Maternal Grandfather   . Stroke Paternal Grandmother   . Macular degeneration Maternal  Grandmother   . Ovarian cancer Neg Hx   . Endometrial cancer Neg Hx     Review of Systems:  Constitutional  Feels well,    ENT Normal appearing ears and nares bilaterally Skin/Breast  No rash, sores, jaundice, itching, dryness Cardiovascular  No chest pain, shortness of breath, or edema  Pulmonary  No cough or wheeze.  Gastro Intestinal  No nausea, vomitting, or diarrhoea. No bright red blood per rectum, no abdominal pain, change in bowel movement, or constipation.  Genito Urinary  No frequency, urgency, dysuria, + abnormal uterine bleeding Musculo Skeletal  No myalgia, arthralgia, joint swelling or pain  Neurologic  No weakness, numbness, change in gait,  Psychology  No depression, anxiety, insomnia.   Vitals:  Blood pressure 133/66, pulse 91, temperature 98.3 F (36.8 C), temperature source Temporal, resp. rate 18, height 5' 6.5" (1.689 m), weight 282 lb (127.9 kg).  Physical Exam: WD in NAD Neck  Supple NROM, without any enlargements.  Lymph Node Survey No cervical supraclavicular or inguinal adenopathy Cardiovascular  Pulse normal rate, regularity and rhythm. S1 and S2 normal.  Lungs  Clear to auscultation bilateraly, without wheezes/crackles/rhonchi. Good air movement.  Skin  No rash/lesions/breakdown  Psychiatry  Alert and oriented to person, place, and time  Abdomen  Normoactive bowel sounds, abdomen soft, non-tender and obese with palpable 1cm umbilical hernia.  Back No CVA tenderness Genito Urinary  Vulva/vagina: Normal external female genitalia.   No lesions. No discharge or bleeding.  Bladder/urethra:  No lesions or masses, well supported bladder  Vagina: normal  Cervix: Normal appearing, no lesions.  Uterus: Does not feel enlarged, mobile, no parametrial involvement or nodularity.  Adnexa: no palpable masses. Rectal  deferred Extremities  No bilateral cyanosis, clubbing or edema.   Thereasa Solo, MD  04/11/2019, 11:10 AM

## 2019-04-11 NOTE — Patient Instructions (Signed)
Preparing for your Surgery  Plan for surgery on April 28, 2019 with Dr. Everitt Amber at [facility]. You will be scheduled for a robotic assisted total laparoscopic hysterectomy, bilateral salpingo-oophorectomy, sentinel lymph node biopsy.   STOP NAPROSYN NOW.  Pre-operative Testing -You will receive a phone call from presurgical testing at [facility] if you have not received a call already to arrange for a pre-operative testing appointment before your surgery.  This appointment normally occurs one to two weeks before your scheduled surgery.   -Bring your insurance card, copy of an advanced directive if applicable, medication list  -At that visit, you will be asked to sign a consent for a possible blood transfusion in case a transfusion becomes necessary during surgery.  The need for a blood transfusion is rare but having consent is a necessary part of your care.     -You should not be taking blood thinners or aspirin at least ten days prior to surgery unless instructed by your surgeon.  -Do not take supplements such as fish oil (omega 3), red yeast rice, tumeric before your surgery.  Day Before Surgery at [place] will be asked to take in a light diet the day before surgery.  Avoid carbonated beverages.  You will be advised to have nothing to eat or drink after midnight the evening before.    Eat a light diet the day before surgery.  Examples including soups, broths, toast, yogurt, mashed potatoes.  Things to avoid include carbonated beverages (fizzy beverages), raw fruits and raw vegetables, or beans.   If your bowels are filled with gas, your surgeon will have difficulty visualizing your pelvic organs which increases your surgical risks.  Your role in recovery Your role is to become active as soon as directed by your doctor, while still giving yourself time to heal.  Rest when you feel tired. You will be asked to do the following in order to speed your  recovery:  - Cough and breathe deeply. This helps toclear and expand your lungs and can prevent pneumonia.  - Do mild physical activity. Walking or moving your legs help your circulation and body functions return to normal. A staff member will help you when you try to walk and will provide you with simple exercises. Do not try to get up or walk alone the first time. - Actively manage your pain. Managing your pain lets you move in comfort. We will ask you to rate your pain on a scale of zero to 10. It is your responsibility to tell your doctor or nurse where and how much you hurt so your pain can be treated.  Special Considerations -If you are diabetic, you may be placed on insulin after surgery to have closer control over your blood sugars to promote healing and recovery.  This does not mean that you will be discharged on insulin.  If applicable, your oral antidiabetics will be resumed when you are tolerating a solid diet.  -Your final pathology results from surgery should be available around one week after surgery and the results will be relayed to you when available.  -Dr. Lahoma Crocker is the surgeon that assists your GYN Oncologist with surgery.  If you end up staying the night, the next day after your surgery you will either see Dr. Denman George or Dr. Lahoma Crocker.  -FMLA forms can be faxed to [phone] and please allow 5-7 business days for completion.  Pain Management After Surgery -You have been prescribed  your pain medication and bowel regimen medications before surgery so that you can have these available when you are discharged from the hospital. The pain medication is for use ONLY AFTER surgery and a new prescription will not be given.   -Make sure that you have Tylenol and Ibuprofen at home to use on a regular basis after surgery for pain control. We recommend alternating the medications every hour to six hours since they work differently and are processed in the body  differently for pain relief.  -Review the attached handout on narcotic use and their risks and side effects.   Bowel Regimen -You have been prescribed Sennakot-S to take nightly to prevent constipation especially if you are taking the narcotic pain medication intermittently.  It is important to prevent constipation and drink adequate amounts of liquids.  Blood Transfusion Information WHAT IS A BLOOD TRANSFUSION? A transfusion is the replacement of blood or some of its parts. Blood is made up of multiple cells which provide different functions.  Red blood cells carry oxygen and are used for blood loss replacement.  White blood cells fight against infection.  Platelets control bleeding.  Plasma helps clot blood.  Other blood products are available for specialized needs, such as hemophilia or other clotting disorders. BEFORE THE TRANSFUSION  Who gives blood for transfusions?   You may be able to donate blood to be used at a later date on yourself (autologous donation).  Relatives can be asked to donate blood. This is generally not any safer than if you have received blood from a stranger. The same precautions are taken to ensure safety when a relative's blood is donated.  Healthy volunteers who are fully evaluated to make sure their blood is safe. This is blood bank blood. Transfusion therapy is the safest it has ever been in the practice of medicine. Before blood is taken from a donor, a complete history is taken to make sure that person has no history of diseases nor engages in risky social behavior (examples are intravenous drug use or sexual activity with multiple partners). The donor's travel history is screened to minimize risk of transmitting infections, such as malaria. The donated blood is tested for signs of infectious diseases, such as HIV and hepatitis. The blood is then tested to be sure it is compatible with you in order to minimize the chance of a transfusion reaction. If you  or a relative donates blood, this is often done in anticipation of surgery and is not appropriate for emergency situations. It takes many days to process the donated blood. RISKS AND COMPLICATIONS Although transfusion therapy is very safe and saves many lives, the main dangers of transfusion include:   Getting an infectious disease.  Developing a transfusion reaction. This is an allergic reaction to something in the blood you were given. Every precaution is taken to prevent this. The decision to have a blood transfusion has been considered carefully by your caregiver before blood is given. Blood is not given unless the benefits outweigh the risks.  AFTER SURGERY INSTRUCTIONS  04/11/2019  Return to work: 4-6 weeks if applicable  Activity: 1. Be up and out of the bed during the day.  Take a nap if needed.  You may walk up steps but be careful and use the hand rail.  Stair climbing will tire you more than you think, you may need to stop part way and rest.   2. No lifting or straining for 6 weeks.  3. No driving for 1  week(s).  Do not drive if you are taking narcotic pain medicine.  4. Shower daily.  Use soap and water on your incision and pat dry; don't rub.  No tub baths until cleared by your surgeon.   5. No sexual activity and nothing in the vagina for 8 weeks.  6. You may experience a small amount of clear drainage from your incisions, which is normal.  If the drainage persists or increases, please call the office.  7. You may experience vaginal spotting after surgery or around the 6-8 week mark from surgery when the stitches at the top of the vagina begin to dissolve.  The spotting is normal but if you experience heavy bleeding, call our office.  8. Take Tylenol or ibuprofen first for pain and only use narcotic pain medication for severe pain not relieved by the Tylenol or Ibuprofen.  Monitor your Tylenol intake to a max of 4,000 mg.  Diet: 1. Low sodium Heart Healthy Diet is  recommended.  2. It is safe to use a laxative, such as Miralax or Colace, if you have difficulty moving your bowels. You can take Sennakot at bedtime every evening to keep bowel movements regular and to prevent constipation.    Wound Care: 1. Keep clean and dry.  Shower daily.  Reasons to call the Doctor:  Fever - Oral temperature greater than 100.4 degrees Fahrenheit  Foul-smelling vaginal discharge  Difficulty urinating  Nausea and vomiting  Increased pain at the site of the incision that is unrelieved with pain medicine.  Difficulty breathing with or without chest pain  New calf pain especially if only on one side  Sudden, continuing increased vaginal bleeding with or without clots.   Contacts: For questions or concerns you should contact:  Dr. Everitt Amber at [phone]  Joylene John, NP at [phone]  After Hours: call [phone] and have the GYN Oncologist paged/contacted

## 2019-04-21 NOTE — Patient Instructions (Addendum)
DUE TO COVID-19 ONLY ONE VISITOR IS ALLOWED TO COME WITH YOU AND STAY IN THE WAITING ROOM ONLY DURING PRE OP AND PROCEDURE DAY OF SURGERY. THE 1 VISITOR MAY VISIT WITH YOU AFTER SURGERY IN YOUR PRIVATE ROOM DURING VISITING HOURS ONLY!  YOU NEED TO HAVE A COVID 19 TEST ON 04-25-19  @ 10:30 AM, THIS TEST MUST BE DONE BEFORE SURGERY, COME  [place] , [id number].  ([place]) ONCE YOUR COVID TEST IS COMPLETED, PLEASE BEGIN THE QUARANTINE INSTRUCTIONS AS OUTLINED IN YOUR HANDOUT.      Your procedure is scheduled on  04-28-19    Report to [facility] AT  5:30 A. M.   Call this number if you have problems the morning of surgery  [phone].   OUR ADDRESS IS [address].  WE ARE LOCATED IN [facility].                                     REMEMBER:Eat a light diet the day before surgery.  Examples including soups, broths, toast, yogurt, mashed potatoes.  Things to avoid include carbonated beverages (fizzy beverages), raw fruits and raw vegetables, or beans.    If your bowels are filled with gas, your surgeon will have difficulty visualizing your pelvic organs which increases your surgical risks.    Remember: AFTER MIDNIGHT NOTHING BY MOUTH EXCEPT CLEAR LIQUIDS UNTIL 4:30 AM . AFTER 4:30 AM, NOTHING UNTIL AFTER SURGERY     CLEAR LIQUID DIET   Foods Allowed                                                                     Foods Excluded  Coffee and tea, regular and decaf                             liquids that you cannot  Plain Jell-O any favor except red or purple                                           see through such as: Fruit ices (not with fruit pulp)                                     milk, soups, orange juice  Iced Popsicles                                    All solid food Carbonated beverages, regular and diet                                    Cranberry, grape and apple juices Sports drinks like  Gatorade Lightly seasoned clear broth or consume(fat free) Sugar, honey syrup     TAKE  THESE MEDICATIONS MORNING OF SURGERY WITH A SIP OF WATER: None. You may use your inhaler __________________________________  IF YOU ARE SPENDING THE NIGHT AFTER SURGERY PLEASE BRING ALL YOUR PRESCRIPTION MEDICATIONS IN THEIR ORIGINAL BOTTLES. 1 VISITOR IS ALLOWED IN WAITING ROOM ONLY DAY OF SURGERY. NO VISITOR MAY SPEND THE NIGHT. VISITOR ARE ALLOWED TO STAY UNTIL 800 PM.                                    DO NOT WEAR JEWERLY, MAKE UP, OR NAIL POLISH ON FINGERNAILS.  DO NOT WEAR LOTIONS, POWDERS, PERFUMES OR DEODORANT.  DO NOT SHAVE FOR 24 HOURS PRIOR TO DAY OF SURGERY.  CONTACTS, GLASSES, OR DENTURES MAY NOT BE WORN TO SURGERY.                                    [facility] IS NOT RESPONSIBLE  FOR ANY BELONGINGS.                                                                    Marland Kitchen                                                                                                                         [place] - Preparing for Surgery Before surgery, you can play an important role.  Because skin is not sterile, your skin needs to be as free of germs as possible.  You can reduce the number of germs on your skin by washing with CHG (chlorahexidine gluconate) soap before surgery.  CHG is an antiseptic cleaner which kills germs and bonds with the skin to continue killing germs even after washing. Please DO NOT use if you have an allergy to CHG or antibacterial soaps.  If your skin becomes reddened/irritated stop using the CHG and inform your nurse when you arrive at Short Stay. Do not shave (including legs and underarms) for at least 48 hours prior to the first CHG shower.  You may shave your face/neck. Please follow these instructions carefully:  1.  Shower with CHG Soap the night before surgery and the  morning of Surgery.  2.  If you choose to wash your hair, wash your hair first as usual with your   normal  shampoo.  3.  After you shampoo, rinse your hair and body thoroughly to remove the  shampoo.                           4.  Use CHG as you would any other liquid soap.  You can apply chg directly  to the skin and wash                       Gently with a scrungie or clean washcloth.  5.  Apply the CHG Soap to your body ONLY FROM THE NECK DOWN.   Do not use on face/ open                           Wound or open sores. Avoid contact with eyes, ears mouth and genitals (private parts).                       Wash face,  Genitals (private parts) with your normal soap.             6.  Wash thoroughly, paying special attention to the area where your surgery  will be performed.  7.  Thoroughly rinse your body with warm water from the neck down.  8.  DO NOT shower/wash with your normal soap after using and rinsing off  the CHG Soap.                9.  Pat yourself dry with a clean towel.            10.  Wear clean pajamas.            11.  Place clean sheets on your bed the night of your first shower and do not  sleep with pets. Day of Surgery : Do not apply any lotions/deodorants the morning of surgery.  Please wear clean clothes to the hospital/surgery center.  FAILURE TO FOLLOW THESE INSTRUCTIONS MAY RESULT IN THE CANCELLATION OF YOUR SURGERY PATIENT SIGNATURE_________________________________  NURSE SIGNATURE__________________________________  ________________________________________________________________________   Adam Phenix  An incentive spirometer is a tool that can help keep your lungs clear and active. This tool measures how well you are filling your lungs with each breath. Taking long deep breaths may help reverse or decrease the chance of developing breathing (pulmonary) problems (especially infection) following:  A long period of time when you are unable to move or be active. BEFORE THE PROCEDURE   If the spirometer includes an indicator to show your best effort, your  nurse or respiratory therapist will set it to a desired goal.  If possible, sit up straight or lean slightly forward. Try not to slouch.  Hold the incentive spirometer in an upright position. INSTRUCTIONS FOR USE  1. Sit on the edge of your bed if possible, or sit up as far as you can in bed or on a chair. 2. Hold the incentive spirometer in an upright position. 3. Breathe out normally. 4. Place the mouthpiece in your mouth and seal your lips tightly around it. 5. Breathe in slowly and as deeply as possible, raising the piston or the ball toward the top of the column. 6. Hold your breath for 3-5 seconds or for as long as possible. Allow the piston or ball to fall to the bottom of the column. 7. Remove the mouthpiece from your mouth and breathe out normally. 8. Rest for a few seconds and repeat Steps 1 through 7 at least 10 times every 1-2 hours when you are awake. Take your time and take a few normal breaths between deep breaths. 9. The spirometer may include an indicator to show your best effort. Use the indicator as a goal to work toward  during each repetition. 10. After each set of 10 deep breaths, practice coughing to be sure your lungs are clear. If you have an incision (the cut made at the time of surgery), support your incision when coughing by placing a pillow or rolled up towels firmly against it. Once you are able to get out of bed, walk around indoors and cough well. You may stop using the incentive spirometer when instructed by your caregiver.  RISKS AND COMPLICATIONS  Take your time so you do not get dizzy or light-headed.  If you are in pain, you may need to take or ask for pain medication before doing incentive spirometry. It is harder to take a deep breath if you are having pain. AFTER USE  Rest and breathe slowly and easily.  It can be helpful to keep track of a log of your progress. Your caregiver can provide you with a simple table to help with this. If you are using the  spirometer at home, follow these instructions: [place] IF:   You are having difficultly using the spirometer.  You have trouble using the spirometer as often as instructed.  Your pain medication is not giving enough relief while using the spirometer.  You develop fever of 100.5 F (38.1 C) or higher. SEEK IMMEDIATE MEDICAL CARE IF:   You cough up bloody sputum that had not been present before.  You develop fever of 102 F (38.9 C) or greater.  You develop worsening pain at or near the incision site. MAKE SURE YOU:   Understand these instructions.  Will watch your condition.  Will get help right away if you are not doing well or get worse. Document Released: 10/20/2006 Document Revised: 09/01/2011 Document Reviewed: 12/21/2006 ExitCare Patient Information 2014 ExitCare, [place].   ________________________________________________________________________  WHAT IS A BLOOD TRANSFUSION? Blood Transfusion Information  A transfusion is the replacement of blood or some of its parts. Blood is made up of multiple cells which provide different functions.  Red blood cells carry oxygen and are used for blood loss replacement.  White blood cells fight against infection.  Platelets control bleeding.  Plasma helps clot blood.  Other blood products are available for specialized needs, such as hemophilia or other clotting disorders. BEFORE THE TRANSFUSION  Who gives blood for transfusions?   Healthy volunteers who are fully evaluated to make sure their blood is safe. This is blood bank blood. Transfusion therapy is the safest it has ever been in the practice of medicine. Before blood is taken from a donor, a complete history is taken to make sure that person has no history of diseases nor engages in risky social behavior (examples are intravenous drug use or sexual activity with multiple partners). The donor's travel history is screened to minimize risk of transmitting  infections, such as malaria. The donated blood is tested for signs of infectious diseases, such as HIV and hepatitis. The blood is then tested to be sure it is compatible with you in order to minimize the chance of a transfusion reaction. If you or a relative donates blood, this is often done in anticipation of surgery and is not appropriate for emergency situations. It takes many days to process the donated blood. RISKS AND COMPLICATIONS Although transfusion therapy is very safe and saves many lives, the main dangers of transfusion include:   Getting an infectious disease.  Developing a transfusion reaction. This is an allergic reaction to something in the blood you were given. Every precaution is taken to  prevent this. The decision to have a blood transfusion has been considered carefully by your caregiver before blood is given. Blood is not given unless the benefits outweigh the risks. AFTER THE TRANSFUSION  Right after receiving a blood transfusion, you will usually feel much better and more energetic. This is especially true if your red blood cells have gotten low (anemic). The transfusion raises the level of the red blood cells which carry oxygen, and this usually causes an energy increase.  The nurse administering the transfusion will monitor you carefully for complications. HOME CARE INSTRUCTIONS  No special instructions are needed after a transfusion. You may find your energy is better. Speak with your caregiver about any limitations on activity for underlying diseases you may have. SEEK MEDICAL CARE IF:   Your condition is not improving after your transfusion.  You develop redness or irritation at the intravenous (IV) site. SEEK IMMEDIATE MEDICAL CARE IF:  Any of the following symptoms occur over the next 12 hours:  Shaking chills.  You have a temperature by mouth above 102 F (38.9 C), not controlled by medicine.  Chest, back, or muscle pain.  People around you feel you are  not acting correctly or are confused.  Shortness of breath or difficulty breathing.  Dizziness and fainting.  You get a rash or develop hives.  You have a decrease in urine output.  Your urine turns a dark color or changes to pink, red, or brown. Any of the following symptoms occur over the next 10 days:  You have a temperature by mouth above 102 F (38.9 C), not controlled by medicine.  Shortness of breath.  Weakness after normal activity.  The white part of the eye turns yellow (jaundice).  You have a decrease in the amount of urine or are urinating less often.  Your urine turns a dark color or changes to pink, red, or brown. Document Released: 06/06/2000 Document Revised: 09/01/2011 Document Reviewed: 01/24/2008 [facility] Patient Information 2014 [place], [place].  _______________________________________________________________________

## 2019-04-22 NOTE — Progress Notes (Signed)
PCP - Betty Martinique Cardiologist -   Chest x-ray -  EKG - 04-25-19  Stress Test -  ECHO -  Cardiac Cath -   Sleep Study -  CPAP -   Fasting Blood Sugar -  Checks Blood Sugar _____ times a day  Blood Thinner Instructions: Aspirin Instructions: Last Dose:  Anesthesia review:   Patient denies shortness of breath, fever, cough and chest pain at PAT appointment   Patient verbalized understanding of instructions that were given to them at the PAT appointment. Patient was also instructed that they will need to review over the PAT instructions again at home before surgery.

## 2019-04-25 ENCOUNTER — Encounter (HOSPITAL_COMMUNITY): Payer: Self-pay

## 2019-04-25 ENCOUNTER — Other Ambulatory Visit: Payer: Self-pay | Admitting: Gynecologic Oncology

## 2019-04-25 ENCOUNTER — Encounter (HOSPITAL_COMMUNITY)
Admission: RE | Admit: 2019-04-25 | Discharge: 2019-04-25 | Disposition: A | Discharge status: Home / Self Care | Payer: Federal, State, Local not specified - PPO | Source: Ambulatory Visit | Attending: Gynecologic Oncology | Admitting: Gynecologic Oncology

## 2019-04-25 ENCOUNTER — Other Ambulatory Visit (HOSPITAL_COMMUNITY)
Admission: RE | Admit: 2019-04-25 | Discharge: 2019-04-25 | Disposition: A | Discharge status: Home / Self Care | Payer: Federal, State, Local not specified - PPO | Source: Ambulatory Visit | Attending: Gynecologic Oncology | Admitting: Gynecologic Oncology

## 2019-04-25 ENCOUNTER — Other Ambulatory Visit: Payer: Self-pay

## 2019-04-25 ENCOUNTER — Telehealth: Payer: Self-pay

## 2019-04-25 DIAGNOSIS — S99922A Unspecified injury of left foot, initial encounter: Secondary | ICD-10-CM | POA: Diagnosis not present

## 2019-04-25 DIAGNOSIS — C541 Malignant neoplasm of endometrium: Secondary | ICD-10-CM | POA: Diagnosis not present

## 2019-04-25 DIAGNOSIS — G629 Polyneuropathy, unspecified: Secondary | ICD-10-CM | POA: Diagnosis not present

## 2019-04-25 DIAGNOSIS — Z823 Family history of stroke: Secondary | ICD-10-CM | POA: Diagnosis not present

## 2019-04-25 DIAGNOSIS — F329 Major depressive disorder, single episode, unspecified: Secondary | ICD-10-CM | POA: Diagnosis not present

## 2019-04-25 DIAGNOSIS — G43909 Migraine, unspecified, not intractable, without status migrainosus: Secondary | ICD-10-CM | POA: Diagnosis not present

## 2019-04-25 DIAGNOSIS — Z888 Allergy status to other drugs, medicaments and biological substances status: Secondary | ICD-10-CM | POA: Diagnosis not present

## 2019-04-25 DIAGNOSIS — Z6841 Body Mass Index (BMI) 40.0 and over, adult: Secondary | ICD-10-CM | POA: Diagnosis not present

## 2019-04-25 DIAGNOSIS — Z7951 Long term (current) use of inhaled steroids: Secondary | ICD-10-CM | POA: Diagnosis not present

## 2019-04-25 DIAGNOSIS — N83201 Unspecified ovarian cyst, right side: Secondary | ICD-10-CM | POA: Diagnosis not present

## 2019-04-25 DIAGNOSIS — Z820 Family history of epilepsy and other diseases of the nervous system: Secondary | ICD-10-CM | POA: Diagnosis not present

## 2019-04-25 DIAGNOSIS — D251 Intramural leiomyoma of uterus: Secondary | ICD-10-CM | POA: Diagnosis not present

## 2019-04-25 DIAGNOSIS — Z8042 Family history of malignant neoplasm of prostate: Secondary | ICD-10-CM | POA: Diagnosis not present

## 2019-04-25 DIAGNOSIS — Z8261 Family history of arthritis: Secondary | ICD-10-CM | POA: Diagnosis not present

## 2019-04-25 DIAGNOSIS — Z91040 Latex allergy status: Secondary | ICD-10-CM | POA: Diagnosis not present

## 2019-04-25 DIAGNOSIS — N83202 Unspecified ovarian cyst, left side: Secondary | ICD-10-CM | POA: Diagnosis not present

## 2019-04-25 DIAGNOSIS — J45909 Unspecified asthma, uncomplicated: Secondary | ICD-10-CM | POA: Diagnosis not present

## 2019-04-25 DIAGNOSIS — Z79899 Other long term (current) drug therapy: Secondary | ICD-10-CM | POA: Diagnosis not present

## 2019-04-25 DIAGNOSIS — X58XXXA Exposure to other specified factors, initial encounter: Secondary | ICD-10-CM | POA: Diagnosis not present

## 2019-04-25 DIAGNOSIS — E876 Hypokalemia: Secondary | ICD-10-CM

## 2019-04-25 DIAGNOSIS — G4700 Insomnia, unspecified: Secondary | ICD-10-CM | POA: Diagnosis not present

## 2019-04-25 DIAGNOSIS — Z01812 Encounter for preprocedural laboratory examination: Secondary | ICD-10-CM | POA: Insufficient documentation

## 2019-04-25 DIAGNOSIS — I10 Essential (primary) hypertension: Secondary | ICD-10-CM | POA: Diagnosis not present

## 2019-04-25 DIAGNOSIS — Z791 Long term (current) use of non-steroidal anti-inflammatories (NSAID): Secondary | ICD-10-CM | POA: Diagnosis not present

## 2019-04-25 DIAGNOSIS — Z8249 Family history of ischemic heart disease and other diseases of the circulatory system: Secondary | ICD-10-CM | POA: Diagnosis not present

## 2019-04-25 DIAGNOSIS — N838 Other noninflammatory disorders of ovary, fallopian tube and broad ligament: Secondary | ICD-10-CM | POA: Diagnosis not present

## 2019-04-25 HISTORY — DX: Other complications of anesthesia, initial encounter: T88.59XA

## 2019-04-25 HISTORY — DX: Other specified postprocedural states: R11.2

## 2019-04-25 LAB — CBC
HCT: 40.4 % (ref 36.0–46.0)
Hemoglobin: 13.5 g/dL (ref 12.0–15.0)
MCH: 29.5 pg (ref 26.0–34.0)
MCHC: 33.4 g/dL (ref 30.0–36.0)
MCV: 88.2 fL (ref 80.0–100.0)
Platelets: 295 10*3/uL (ref 150–400)
RBC: 4.58 MIL/uL (ref 3.87–5.11)
RDW: 13.4 % (ref 11.5–15.5)
WBC: 9.5 10*3/uL (ref 4.0–10.5)
nRBC: 0 % (ref 0.0–0.2)

## 2019-04-25 LAB — COMPREHENSIVE METABOLIC PANEL WITH GFR
ALT: 34 U/L (ref 0–44)
AST: 21 U/L (ref 15–41)
Albumin: 4.3 g/dL (ref 3.5–5.0)
Alkaline Phosphatase: 62 U/L (ref 38–126)
Anion gap: 10 (ref 5–15)
BUN: 17 mg/dL (ref 6–20)
CO2: 25 mmol/L (ref 22–32)
Calcium: 10.1 mg/dL (ref 8.9–10.3)
Chloride: 105 mmol/L (ref 98–111)
Creatinine, Ser: 0.71 mg/dL (ref 0.44–1.00)
GFR calc Af Amer: >60 mL/min
GFR calc non Af Amer: >60 mL/min
Glucose, Bld: 98 mg/dL (ref 70–99)
Potassium: 3.1 mmol/L — ABNORMAL LOW (ref 3.5–5.1)
Sodium: 140 mmol/L (ref 135–145)
Total Bilirubin: 0.8 mg/dL (ref 0.3–1.2)
Total Protein: 7.8 g/dL (ref 6.5–8.1)

## 2019-04-25 LAB — URINALYSIS, ROUTINE W REFLEX MICROSCOPIC
Bilirubin Urine: NEGATIVE
Glucose, UA: NEGATIVE mg/dL
Hgb urine dipstick: NEGATIVE
Ketones, ur: NEGATIVE mg/dL
Nitrite: NEGATIVE
Protein, ur: NEGATIVE mg/dL
Specific Gravity, Urine: 1.012 (ref 1.005–1.030)
pH: 7 (ref 5.0–8.0)

## 2019-04-25 LAB — ABO/RH: ABO/RH(D): O NEG

## 2019-04-25 MED ORDER — POTASSIUM CHLORIDE CRYS ER 20 MEQ PO TBCR: 20.0000 meq | EXTENDED_RELEASE_TABLET | Freq: Two times a day (BID) | ORAL | 0 refills | Status: AC

## 2019-04-25 NOTE — Progress Notes (Signed)
See RN note.

## 2019-04-25 NOTE — Telephone Encounter (Signed)
Told Gabriela Alexander that her potassium level was a little low on her pre op labs at 3.1. Gabriela John, NP wants her to take potassium supplement 20 meq today and twice tomoroww am and pm for a total of three doses. Gabriela Alexander will send it in to [facility]. Pt verbalized understanding.

## 2019-04-26 ENCOUNTER — Telehealth: Payer: Self-pay

## 2019-04-26 ENCOUNTER — Other Ambulatory Visit: Payer: Self-pay | Admitting: Gynecologic Oncology

## 2019-04-26 ENCOUNTER — Other Ambulatory Visit (HOSPITAL_COMMUNITY)
Admission: RE | Admit: 2019-04-26 | Discharge: 2019-04-26 | Disposition: A | Discharge status: Home / Self Care | Payer: Federal, State, Local not specified - PPO | Source: Ambulatory Visit | Attending: Gynecologic Oncology | Admitting: Gynecologic Oncology

## 2019-04-26 DIAGNOSIS — Z01812 Encounter for preprocedural laboratory examination: Secondary | ICD-10-CM | POA: Diagnosis not present

## 2019-04-26 DIAGNOSIS — N390 Urinary tract infection, site not specified: Secondary | ICD-10-CM

## 2019-04-26 DIAGNOSIS — Z20828 Contact with and (suspected) exposure to other viral communicable diseases: Secondary | ICD-10-CM | POA: Diagnosis not present

## 2019-04-26 LAB — NOVEL CORONAVIRUS, NAA (HOSP ORDER, SEND-OUT TO REF LAB; TAT 18-24 HRS)

## 2019-04-26 LAB — SARS CORONAVIRUS 2 (TAT 6-24 HRS): SARS Coronavirus 2: NEGATIVE

## 2019-04-26 MED ORDER — NITROFURANTOIN MONOHYD MACRO 100 MG PO CAPS: 100.0000 mg | ORAL_CAPSULE | Freq: Two times a day (BID) | ORAL | 0 refills | Status: DC

## 2019-04-26 NOTE — Progress Notes (Signed)
Macrobid ordered per Dr. Denman George due to urine culture results.  RN to contact patient.

## 2019-04-26 NOTE — Telephone Encounter (Signed)
-----   Message from Dorothyann Gibbs, NP sent at 04/26/2019  1:35 PM EST ----- Patient has a UTI that needs to be treated before surgery.  I will send in [place] and there is a chance we would need to change if sensitivities come back showing something else.

## 2019-04-26 NOTE — Progress Notes (Signed)
Receive call from Joylene John NP today, inquired about pt's covid test results in epic.  Test stated unable to reliably determine a result for the specimen due to the presence of PCR inhibitor (s) in speciman submitted.  I called and spoke w/ Evonnie Pat RN for advise about this.  Per [place] pt needs to get retested today.  Called and spoke w/ pt via phone and explained to pt about needing to have her covid test redone, per verbalized understanding and stated she would get to [facility] test site as soon as she can today.

## 2019-04-26 NOTE — Telephone Encounter (Signed)
Told ms Crisafulli the results of the urinalysis as noted by Joylene John, NP. Pt verbalized understanding. She will begin ATB today.

## 2019-04-27 LAB — URINE CULTURE: Culture: >=100000 — AB

## 2019-04-28 ENCOUNTER — Other Ambulatory Visit: Payer: Self-pay

## 2019-04-28 ENCOUNTER — Encounter (HOSPITAL_BASED_OUTPATIENT_CLINIC_OR_DEPARTMENT_OTHER)
Admission: RE | Disposition: A | Discharge status: Home / Self Care | Payer: Self-pay | Source: Home / Self Care | Attending: Gynecologic Oncology

## 2019-04-28 ENCOUNTER — Ambulatory Visit (HOSPITAL_BASED_OUTPATIENT_CLINIC_OR_DEPARTMENT_OTHER)
Admission: RE | Admit: 2019-04-28 | Discharge: 2019-04-28 | Disposition: A | Discharge status: Home / Self Care | Payer: Federal, State, Local not specified - PPO | Attending: Gynecologic Oncology | Admitting: Gynecologic Oncology

## 2019-04-28 ENCOUNTER — Encounter (HOSPITAL_BASED_OUTPATIENT_CLINIC_OR_DEPARTMENT_OTHER): Payer: Self-pay

## 2019-04-28 ENCOUNTER — Ambulatory Visit (HOSPITAL_BASED_OUTPATIENT_CLINIC_OR_DEPARTMENT_OTHER): Payer: Federal, State, Local not specified - PPO | Admitting: Certified Registered"

## 2019-04-28 ENCOUNTER — Ambulatory Visit (HOSPITAL_BASED_OUTPATIENT_CLINIC_OR_DEPARTMENT_OTHER): Payer: Federal, State, Local not specified - PPO | Admitting: Physician Assistant

## 2019-04-28 DIAGNOSIS — Z888 Allergy status to other drugs, medicaments and biological substances status: Secondary | ICD-10-CM | POA: Diagnosis not present

## 2019-04-28 DIAGNOSIS — Z820 Family history of epilepsy and other diseases of the nervous system: Secondary | ICD-10-CM | POA: Insufficient documentation

## 2019-04-28 DIAGNOSIS — J45909 Unspecified asthma, uncomplicated: Secondary | ICD-10-CM | POA: Diagnosis not present

## 2019-04-28 DIAGNOSIS — Z8249 Family history of ischemic heart disease and other diseases of the circulatory system: Secondary | ICD-10-CM | POA: Insufficient documentation

## 2019-04-28 DIAGNOSIS — Z91040 Latex allergy status: Secondary | ICD-10-CM | POA: Diagnosis not present

## 2019-04-28 DIAGNOSIS — D251 Intramural leiomyoma of uterus: Secondary | ICD-10-CM | POA: Insufficient documentation

## 2019-04-28 DIAGNOSIS — S99922A Unspecified injury of left foot, initial encounter: Secondary | ICD-10-CM | POA: Insufficient documentation

## 2019-04-28 DIAGNOSIS — X58XXXA Exposure to other specified factors, initial encounter: Secondary | ICD-10-CM | POA: Insufficient documentation

## 2019-04-28 DIAGNOSIS — N83202 Unspecified ovarian cyst, left side: Secondary | ICD-10-CM | POA: Insufficient documentation

## 2019-04-28 DIAGNOSIS — G43909 Migraine, unspecified, not intractable, without status migrainosus: Secondary | ICD-10-CM | POA: Insufficient documentation

## 2019-04-28 DIAGNOSIS — F329 Major depressive disorder, single episode, unspecified: Secondary | ICD-10-CM | POA: Diagnosis not present

## 2019-04-28 DIAGNOSIS — I10 Essential (primary) hypertension: Secondary | ICD-10-CM | POA: Diagnosis not present

## 2019-04-28 DIAGNOSIS — N841 Polyp of cervix uteri: Secondary | ICD-10-CM | POA: Diagnosis not present

## 2019-04-28 DIAGNOSIS — G629 Polyneuropathy, unspecified: Secondary | ICD-10-CM | POA: Diagnosis not present

## 2019-04-28 DIAGNOSIS — Z791 Long term (current) use of non-steroidal anti-inflammatories (NSAID): Secondary | ICD-10-CM | POA: Insufficient documentation

## 2019-04-28 DIAGNOSIS — Z8042 Family history of malignant neoplasm of prostate: Secondary | ICD-10-CM | POA: Insufficient documentation

## 2019-04-28 DIAGNOSIS — Z6841 Body Mass Index (BMI) 40.0 and over, adult: Secondary | ICD-10-CM | POA: Diagnosis not present

## 2019-04-28 DIAGNOSIS — Z8261 Family history of arthritis: Secondary | ICD-10-CM | POA: Insufficient documentation

## 2019-04-28 DIAGNOSIS — N8302 Follicular cyst of left ovary: Secondary | ICD-10-CM | POA: Diagnosis not present

## 2019-04-28 DIAGNOSIS — C541 Malignant neoplasm of endometrium: Secondary | ICD-10-CM | POA: Diagnosis not present

## 2019-04-28 DIAGNOSIS — N83201 Unspecified ovarian cyst, right side: Secondary | ICD-10-CM | POA: Insufficient documentation

## 2019-04-28 DIAGNOSIS — G4700 Insomnia, unspecified: Secondary | ICD-10-CM | POA: Diagnosis not present

## 2019-04-28 DIAGNOSIS — N838 Other noninflammatory disorders of ovary, fallopian tube and broad ligament: Secondary | ICD-10-CM | POA: Insufficient documentation

## 2019-04-28 DIAGNOSIS — Z7951 Long term (current) use of inhaled steroids: Secondary | ICD-10-CM | POA: Insufficient documentation

## 2019-04-28 DIAGNOSIS — N8301 Follicular cyst of right ovary: Secondary | ICD-10-CM | POA: Diagnosis not present

## 2019-04-28 DIAGNOSIS — Z823 Family history of stroke: Secondary | ICD-10-CM | POA: Insufficient documentation

## 2019-04-28 DIAGNOSIS — Z79899 Other long term (current) drug therapy: Secondary | ICD-10-CM | POA: Insufficient documentation

## 2019-04-28 HISTORY — PX: ROBOTIC ASSISTED TOTAL HYSTERECTOMY WITH BILATERAL SALPINGO OOPHERECTOMY: SHX6086

## 2019-04-28 HISTORY — PX: SENTINEL NODE BIOPSY: SHX6608

## 2019-04-28 LAB — TYPE AND SCREEN
ABO/RH(D): O NEG
Antibody Screen: NEGATIVE

## 2019-04-28 LAB — POCT PREGNANCY, URINE: Preg Test, Ur: NEGATIVE

## 2019-04-28 SURGERY — HYSTERECTOMY, TOTAL, ROBOT-ASSISTED, LAPAROSCOPIC, WITH BILATERAL SALPINGO-OOPHORECTOMY
Anesthesia: General

## 2019-04-28 MED ORDER — FENTANYL CITRATE (PF) 250 MCG/5ML IJ SOLN
INTRAMUSCULAR | Status: AC
  Filled 2019-04-28: qty 5

## 2019-04-28 MED ORDER — ENOXAPARIN SODIUM 40 MG/0.4ML ~~LOC~~ SOLN
SUBCUTANEOUS | Status: AC
  Filled 2019-04-28: qty 0.4

## 2019-04-28 MED ORDER — BUPIVACAINE HCL 0.25 % IJ SOLN
INTRAMUSCULAR | Status: DC | PRN
  Administered 2019-04-28: 10 mL

## 2019-04-28 MED ORDER — KETOROLAC TROMETHAMINE 15 MG/ML IJ SOLN
15.0000 mg | Freq: Four times a day (QID) | INTRAMUSCULAR | Status: DC
  Administered 2019-04-28: 15 mg via INTRAVENOUS
  Filled 2019-04-28: qty 1

## 2019-04-28 MED ORDER — WHITE PETROLATUM EX OINT
TOPICAL_OINTMENT | CUTANEOUS | Status: AC
  Filled 2019-04-28: qty 5

## 2019-04-28 MED ORDER — GABAPENTIN 300 MG PO CAPS
300.0000 mg | ORAL_CAPSULE | ORAL | Status: AC
  Administered 2019-04-28: 300 mg via ORAL
  Filled 2019-04-28: qty 1

## 2019-04-28 MED ORDER — SODIUM CHLORIDE 0.9% FLUSH
3.0000 mL | INTRAVENOUS | Status: DC | PRN
  Filled 2019-04-28: qty 3

## 2019-04-28 MED ORDER — ACETAMINOPHEN 650 MG RE SUPP
650.0000 mg | RECTAL | Status: DC | PRN
  Filled 2019-04-28: qty 1

## 2019-04-28 MED ORDER — SODIUM CHLORIDE 0.9 % IR SOLN
Status: DC | PRN
  Administered 2019-04-28: 3000 mL

## 2019-04-28 MED ORDER — ROCURONIUM BROMIDE 10 MG/ML (PF) SYRINGE
PREFILLED_SYRINGE | INTRAVENOUS | Status: AC
  Filled 2019-04-28: qty 10

## 2019-04-28 MED ORDER — ENOXAPARIN SODIUM 40 MG/0.4ML ~~LOC~~ SOLN
40.0000 mg | SUBCUTANEOUS | Status: AC
  Administered 2019-04-28: 40 mg via SUBCUTANEOUS
  Filled 2019-04-28: qty 0.4

## 2019-04-28 MED ORDER — LIDOCAINE 2% (20 MG/ML) 5 ML SYRINGE: INTRAMUSCULAR | Status: DC | PRN

## 2019-04-28 MED ORDER — OXYCODONE HCL 5 MG/5ML PO SOLN
5.0000 mg | Freq: Once | ORAL | Status: DC | PRN
  Filled 2019-04-28: qty 5

## 2019-04-28 MED ORDER — ACETAMINOPHEN 500 MG PO TABS
1000.0000 mg | ORAL_TABLET | ORAL | Status: AC
  Administered 2019-04-28: 1000 mg via ORAL
  Filled 2019-04-28: qty 2

## 2019-04-28 MED ORDER — ONDANSETRON HCL 4 MG/2ML IJ SOLN
INTRAMUSCULAR | Status: AC
  Filled 2019-04-28: qty 2

## 2019-04-28 MED ORDER — CEFAZOLIN SODIUM-DEXTROSE 1-4 GM/50ML-% IV SOLN
INTRAVENOUS | Status: AC
  Filled 2019-04-28: qty 50

## 2019-04-28 MED ORDER — FENTANYL CITRATE (PF) 100 MCG/2ML IJ SOLN
25.0000 ug | INTRAMUSCULAR | Status: DC | PRN
  Filled 2019-04-28: qty 1

## 2019-04-28 MED ORDER — CEFAZOLIN SODIUM 10 G IJ SOLR
3.0000 g | INTRAMUSCULAR | Status: AC
  Administered 2019-04-28: 3 g via INTRAVENOUS
  Filled 2019-04-28: qty 3000

## 2019-04-28 MED ORDER — PROPOFOL 10 MG/ML IV BOLUS
INTRAVENOUS | Status: AC
  Filled 2019-04-28: qty 20

## 2019-04-28 MED ORDER — SUGAMMADEX SODIUM 500 MG/5ML IV SOLN
INTRAVENOUS | Status: AC
  Filled 2019-04-28: qty 5

## 2019-04-28 MED ORDER — SCOPOLAMINE 1 MG/3DAYS TD PT72
1.0000 | MEDICATED_PATCH | TRANSDERMAL | Status: DC
  Administered 2019-04-28: 1.5 mg via TRANSDERMAL
  Filled 2019-04-28: qty 1

## 2019-04-28 MED ORDER — PROMETHAZINE HCL 25 MG/ML IJ SOLN
6.2500 mg | INTRAMUSCULAR | Status: DC | PRN
  Filled 2019-04-28: qty 1

## 2019-04-28 MED ORDER — SUGAMMADEX SODIUM 200 MG/2ML IV SOLN
INTRAVENOUS | Status: DC | PRN
  Administered 2019-04-28: 270 mg via INTRAVENOUS

## 2019-04-28 MED ORDER — MENTHOL 3 MG MT LOZG
LOZENGE | OROMUCOSAL | Status: AC
  Filled 2019-04-28: qty 9

## 2019-04-28 MED ORDER — SODIUM CHLORIDE 0.9% FLUSH
3.0000 mL | Freq: Two times a day (BID) | INTRAVENOUS | Status: DC
  Filled 2019-04-28: qty 3

## 2019-04-28 MED ORDER — LACTATED RINGERS IV SOLN
INTRAVENOUS | Status: DC
  Administered 2019-04-28: 50 mL/h via INTRAVENOUS
  Administered 2019-04-28: 09:00:00 via INTRAVENOUS
  Filled 2019-04-28: qty 1000

## 2019-04-28 MED ORDER — FENTANYL CITRATE (PF) 100 MCG/2ML IJ SOLN
INTRAMUSCULAR | Status: DC | PRN
  Administered 2019-04-28 (×2): 25 ug via INTRAVENOUS
  Administered 2019-04-28: 50 ug via INTRAVENOUS
  Administered 2019-04-28: 25 ug via INTRAVENOUS
  Administered 2019-04-28: 75 ug via INTRAVENOUS
  Administered 2019-04-28 (×2): 25 ug via INTRAVENOUS

## 2019-04-28 MED ORDER — CELECOXIB 200 MG PO CAPS
ORAL_CAPSULE | ORAL | Status: AC
  Filled 2019-04-28: qty 2

## 2019-04-28 MED ORDER — SCOPOLAMINE 1 MG/3DAYS TD PT72
MEDICATED_PATCH | TRANSDERMAL | Status: AC
  Filled 2019-04-28: qty 1

## 2019-04-28 MED ORDER — ROCURONIUM BROMIDE 10 MG/ML (PF) SYRINGE
PREFILLED_SYRINGE | INTRAVENOUS | Status: DC | PRN
  Administered 2019-04-28: 70 mg via INTRAVENOUS
  Administered 2019-04-28: 10 mg via INTRAVENOUS

## 2019-04-28 MED ORDER — LIDOCAINE 2% (20 MG/ML) 5 ML SYRINGE
INTRAMUSCULAR | Status: AC
  Filled 2019-04-28: qty 5

## 2019-04-28 MED ORDER — CEFAZOLIN SODIUM-DEXTROSE 2-4 GM/100ML-% IV SOLN
INTRAVENOUS | Status: AC
  Filled 2019-04-28: qty 100

## 2019-04-28 MED ORDER — OXYCODONE HCL 5 MG PO TABS
5.0000 mg | ORAL_TABLET | ORAL | Status: DC | PRN
  Filled 2019-04-28: qty 2

## 2019-04-28 MED ORDER — SODIUM CHLORIDE 0.9 % IV SOLN
250.0000 mL | INTRAVENOUS | Status: DC | PRN
  Filled 2019-04-28: qty 250

## 2019-04-28 MED ORDER — MIDAZOLAM HCL 2 MG/2ML IJ SOLN
INTRAMUSCULAR | Status: AC
  Filled 2019-04-28: qty 2

## 2019-04-28 MED ORDER — KETOROLAC TROMETHAMINE 30 MG/ML IJ SOLN
INTRAMUSCULAR | Status: AC
  Filled 2019-04-28: qty 1

## 2019-04-28 MED ORDER — MORPHINE SULFATE (PF) 2 MG/ML IV SOLN
2.0000 mg | INTRAVENOUS | Status: DC | PRN
  Filled 2019-04-28: qty 1

## 2019-04-28 MED ORDER — ONDANSETRON HCL 4 MG/2ML IJ SOLN
INTRAMUSCULAR | Status: DC | PRN
  Administered 2019-04-28: 4 mg via INTRAVENOUS

## 2019-04-28 MED ORDER — PROPOFOL 10 MG/ML IV BOLUS
INTRAVENOUS | Status: DC | PRN
  Administered 2019-04-28: 250 mg via INTRAVENOUS

## 2019-04-28 MED ORDER — CELECOXIB 400 MG PO CAPS
400.0000 mg | ORAL_CAPSULE | ORAL | Status: AC
  Administered 2019-04-28: 400 mg via ORAL
  Filled 2019-04-28: qty 1

## 2019-04-28 MED ORDER — PROPOFOL 10 MG/ML IV BOLUS
INTRAVENOUS | Status: AC
  Filled 2019-04-28: qty 40

## 2019-04-28 MED ORDER — MIDAZOLAM HCL 2 MG/2ML IJ SOLN
INTRAMUSCULAR | Status: DC | PRN
  Administered 2019-04-28: 2 mg via INTRAVENOUS

## 2019-04-28 MED ORDER — GABAPENTIN 300 MG PO CAPS
ORAL_CAPSULE | ORAL | Status: AC
  Filled 2019-04-28: qty 1

## 2019-04-28 MED ORDER — INDOCYANINE GREEN 25 MG IV SOLR
INTRAVENOUS | Status: DC | PRN
  Administered 2019-04-28: 25 mg

## 2019-04-28 MED ORDER — DEXAMETHASONE SODIUM PHOSPHATE 4 MG/ML IJ SOLN
4.0000 mg | INTRAMUSCULAR | Status: DC
  Filled 2019-04-28: qty 1

## 2019-04-28 MED ORDER — LIDOCAINE 2% (20 MG/ML) 5 ML SYRINGE
INTRAMUSCULAR | Status: DC | PRN
  Administered 2019-04-28: 100 mg via INTRAVENOUS

## 2019-04-28 MED ORDER — LIDOCAINE 2% (20 MG/ML) 5 ML SYRINGE
INTRAMUSCULAR | Status: DC | PRN
  Administered 2019-04-28: 1.5 mg/kg/h via INTRAVENOUS

## 2019-04-28 MED ORDER — ACETAMINOPHEN 325 MG PO TABS
650.0000 mg | ORAL_TABLET | ORAL | Status: DC | PRN
  Filled 2019-04-28: qty 2

## 2019-04-28 MED ORDER — STERILE WATER FOR INJECTION IJ SOLN
INTRAMUSCULAR | Status: DC | PRN
  Administered 2019-04-28: 10 mL

## 2019-04-28 MED ORDER — OXYCODONE HCL 5 MG PO TABS
5.0000 mg | ORAL_TABLET | Freq: Once | ORAL | Status: DC | PRN
  Filled 2019-04-28: qty 1

## 2019-04-28 MED ORDER — ACETAMINOPHEN 500 MG PO TABS
ORAL_TABLET | ORAL | Status: AC
  Filled 2019-04-28: qty 2

## 2019-04-28 SURGICAL SUPPLY — 57 items
APPLICATOR SURGIFLO ENDO (HEMOSTASIS) IMPLANT
BAG LAPAROSCOPIC 12 15 PORT 16 (BASKET) IMPLANT
BAG RETRIEVAL 12/15 (BASKET)
BLADE SURG 10 STRL SS (BLADE) IMPLANT
COVER BACK TABLE 60X90IN (DRAPES) ×2 IMPLANT
COVER TIP SHEARS 8 DVNC (MISCELLANEOUS) ×1 IMPLANT
COVER TIP SHEARS 8MM DA VINCI (MISCELLANEOUS) ×1
COVER WAND RF STERILE (DRAPES) ×2 IMPLANT
DECANTER SPIKE VIAL GLASS SM (MISCELLANEOUS) IMPLANT
DERMABOND ADVANCED (GAUZE/BANDAGES/DRESSINGS) ×1
DERMABOND ADVANCED .7 DNX12 (GAUZE/BANDAGES/DRESSINGS) ×1 IMPLANT
DRAPE ARM DVNC X/XI (DISPOSABLE) ×4 IMPLANT
DRAPE COLUMN DVNC XI (DISPOSABLE) ×1 IMPLANT
DRAPE DA VINCI XI ARM (DISPOSABLE) ×4
DRAPE DA VINCI XI COLUMN (DISPOSABLE) ×1
DRAPE SHEET LG 3/4 BI-LAMINATE (DRAPES) ×2 IMPLANT
DRAPE SURG IRRIG POUCH 19X23 (DRAPES) ×2 IMPLANT
ELECT REM PT RETURN 9FT ADLT (ELECTROSURGICAL) ×2
ELECTRODE REM PT RTRN 9FT ADLT (ELECTROSURGICAL) ×1 IMPLANT
GAUZE 4X4 16PLY RFD (DISPOSABLE) ×2 IMPLANT
GLOVE BIO SURGEON STRL SZ 6 (GLOVE) ×8 IMPLANT
GLOVE BIO SURGEON STRL SZ 6.5 (GLOVE) ×4 IMPLANT
GOWN STRL REUS W/TWL LRG LVL3 (GOWN DISPOSABLE) ×4 IMPLANT
HOLDER FOLEY CATH W/STRAP (MISCELLANEOUS) ×2 IMPLANT
IRRIG SUCT STRYKERFLOW 2 WTIP (MISCELLANEOUS) ×2
IRRIGATION SUCT STRKRFLW 2 WTP (MISCELLANEOUS) ×1 IMPLANT
KIT PROCEDURE DA VINCI SI (MISCELLANEOUS) ×1
KIT PROCEDURE DVNC SI (MISCELLANEOUS) IMPLANT
KIT TURNOVER CYSTO (KITS) ×1 IMPLANT
LEGGING LITHOTOMY PAIR STRL (DRAPES) ×2 IMPLANT
MANIPULATOR UTERINE 4.5 ZUMI (MISCELLANEOUS) ×2 IMPLANT
NDL SPNL 18GX3.5 QUINCKE PK (NEEDLE) IMPLANT
NEEDLE HYPO 22GX1.5 SAFETY (NEEDLE) ×2 IMPLANT
NEEDLE SPNL 18GX3.5 QUINCKE PK (NEEDLE) ×2 IMPLANT
OBTURATOR OPTICAL STANDARD 8MM (TROCAR) ×1
OBTURATOR OPTICAL STND 8 DVNC (TROCAR) ×1
OBTURATOR OPTICALSTD 8 DVNC (TROCAR) ×1 IMPLANT
PACK ROBOT GYN CUSTOM WL (TRAY / TRAY PROCEDURE) ×2 IMPLANT
PAD POSITIONING PINK XL (MISCELLANEOUS) ×2 IMPLANT
PENCIL BUTTON HOLSTER BLD 10FT (ELECTRODE) IMPLANT
PORT ACCESS TROCAR AIRSEAL 12 (TROCAR) IMPLANT
PORT ACCESS TROCAR AIRSEAL 5M (TROCAR)
POUCH SPECIMEN RETRIEVAL 10MM (ENDOMECHANICALS) IMPLANT
SEAL CANN UNIV 5-8 DVNC XI (MISCELLANEOUS) ×3 IMPLANT
SEAL XI 5MM-8MM UNIVERSAL (MISCELLANEOUS) ×4
SET TRI-LUMEN FLTR TB AIRSEAL (TUBING) ×2 IMPLANT
SURGIFLO W/THROMBIN 8M KIT (HEMOSTASIS) IMPLANT
SUT VIC AB 0 CT1 36 (SUTURE) ×1 IMPLANT
SUT VIC AB 4-0 PS2 18 (SUTURE) ×4 IMPLANT
SYR 10ML LL (SYRINGE) ×1 IMPLANT
TRAP SPECIMEN MUCOUS 40CC (MISCELLANEOUS) IMPLANT
TRAY FOLEY W/BAG SLVR 14FR (SET/KITS/TRAYS/PACK) ×2 IMPLANT
TROCAR XCEL 12X100 BLDLESS (ENDOMECHANICALS) ×2 IMPLANT
TUBE CONNECTING 12X1/4 (SUCTIONS) ×2 IMPLANT
UNDERPAD 30X30 (UNDERPADS AND DIAPERS) ×2 IMPLANT
WATER STERILE IRR 1000ML POUR (IV SOLUTION) ×2 IMPLANT
YANKAUER SUCT BULB TIP NO VENT (SUCTIONS) IMPLANT

## 2019-04-28 NOTE — Discharge Instructions (Addendum)
04/28/2019  Return to work: 4 weeks  Activity: 1. Be up and out of the bed during the day.  Take a nap if needed.  You may walk up steps but be careful and use the hand rail.  Stair climbing will tire you more than you think, you may need to stop part way and rest.   2. No lifting or straining for 6 weeks.  3. No driving for 1 weeks.  Do Not drive if you are taking narcotic pain medicine.  4. Shower daily.  Use soap and water on your incision and pat dry; don't rub.   5. No sexual activity and nothing in the vagina for 8 weeks.  Medications:  - Take ibuprofen and tylenol first line for pain control. Take these regularly (every 6 hours) to decrease the build up of pain.  - If necessary, for severe pain not relieved by ibuprofen, take percocet.  - While taking percocet you should take sennakot every night to reduce the likelihood of constipation. If this causes diarrhea, stop its use.  Diet: 1. Low sodium Heart Healthy Diet is recommended.  2. It is safe to use a laxative if you have difficulty moving your bowels.   Wound Care: 1. Keep clean and dry.  Shower daily.  Reasons to call the Doctor:   Fever - Oral temperature greater than 100.4 degrees Fahrenheit  Foul-smelling vaginal discharge  Difficulty urinating  Nausea and vomiting  Increased pain at the site of the incision that is unrelieved with pain medicine.  Difficulty breathing with or without chest pain  New calf pain especially if only on one side  Sudden, continuing increased vaginal bleeding with or without clots.   Follow-up: 1. See Everitt Amber in 3 weeks.  Contacts: For questions or concerns you should contact:  Dr. Everitt Amber at [phone] After hours and on week-ends call [phone] and ask to speak to the physician on call for Gynecologic Oncology  After Your Surgery  The information in this section will tell you what to expect after your surgery, both during your stay and after you leave.  You will learn how to safely recover from your surgery. Write down any questions you have and be sure to ask your doctor or nurse.  What to Expect When you wake up after your surgery, you will be in the [facility] (PACU) or your recovery room. A nurse will be monitoring your body temperature, blood pressure, pulse, and oxygen levels. You may have a urinary catheter in your bladder to help monitor the amount of urine you are making. It should come out before you go home. You will also have compression boots on your lower legs to help your circulation. Your pain medication will be given through an IV line or in tablet form. If you are having pain, tell your nurse. Your nurse will tell you how to recover from your surgery. Below are examples of ways you can help yourself recover safely.  You will be encouraged to walk with the help of your nurse or physical therapist. We will give you medication to relieve pain. Walking helps reduce the risk for blood clots and pneumonia. It also helps to stimulate your bowels so they begin working again.  Use your incentive spirometer. This will help your lungs expand, which prevents pneumonia.   Commonly Asked Questions  Will I have pain after surgery? Yes, you will have some pain after your surgery, especially in the first few days. Your  doctor and nurse will ask you about your pain often. You will be given medication to manage your pain as needed. If your pain is not relieved, please tell your doctor or nurse. It is important to control your pain so you can cough, breathe deeply, use your incentive spirometer, and get out of bed and walk.  Will I be able to eat? Yes, you will be able to eat a regular diet or eat as tolerated. You should start with foods that are soft and easy to digest such as apple sauce and chicken noodle soup. Eat small meals frequently, and then advance to regular foods. If you experience bloating, gas, or cramps, limit  high-fiber foods, including whole grain breads and cereal, nuts, seeds, salads, fresh fruit, broccoli, cabbage, and cauliflower. Will I have pain when I am home? The length of time each person has pain or discomfort varies. You may still have some pain when you go home and will probably be taking pain medication. Follow the guidelines below.  Take your medications as directed and as needed.  Call your doctor if the medication prescribed for you doesn't relieve your pain.  Don't drive or drink alcohol while you're taking prescription pain medication.  As your incision heals, you will have less pain and need less pain medication. A mild pain reliever such as acetaminophen (Tylenol) or ibuprofen (Advil) will relieve aches and discomfort. However, large quantities of acetaminophen may be harmful to your liver. Don't take more acetaminophen than the amount directed on the bottle or as instructed by your doctor or nurse.  Pain medication should help you as you resume your normal activities. Take enough medication to do your exercises comfortably. Pain medication is most effective 30 to 45 minutes after taking it.  Keep track of when you take your pain medication. Taking it when your pain first begins is more effective than waiting for the pain to get worse. Pain medication may cause constipation (having fewer bowel movements than what is normal for you).  How can I prevent constipation?  Go to the bathroom at the same time every day. Your body will get used to going at that time.  If you feel the urge to go, don't put it off. Try to use the bathroom 5 to 15 minutes after meals.  After breakfast is a good time to move your bowels. The reflexes in your colon are strongest at this time.  Exercise, if you can. Walking is an excellent form of exercise.  Drink 8 (8-ounce) glasses (2 liters) of liquids daily, if you can. Drink water, juices, soups, ice cream shakes, and other drinks that don't have  caffeine. Drinks with caffeine, such as coffee and soda, pull fluid out of the body.  Slowly increase the fiber in your diet to 25 to 35 grams per day. Fruits, vegetables, whole grains, and cereals contain fiber. If you have an ostomy or have had recent bowel surgery, check with your doctor or nurse before making any changes in your diet.  Both over-the-counter and prescription medications are available to treat constipation. Start with 1 of the following over-the-counter medications first: o Docusate sodium (Colace) 100 mg. Take ___1__ capsules _2____ times a day. This is a stool softener that causes few side effects. Don't take it with mineral oil. o Polyethylene glycol (MiraLAX) 17 grams daily. o Senna (Senokot) 2 tablets at bedtime. This is a stimulant laxative, which can cause cramping.  If you haven't had a bowel movement in 2 days,  call your doctor or nurse.  Can I shower? Yes, you should shower 24 hours after your surgery. Be sure to shower every day. Taking a warm shower is relaxing and can help decrease muscle aches. Use soap when you shower and gently wash your incision. Pat the areas dry with a towel after showering, and leave your incision uncovered (unless there is drainage). Call your doctor if you see any redness or drainage from your incision. Don't take tub baths until you discuss it with your doctor at the first appointment after your surgery. How do I care for my incisions? You will have several small incisions on your abdomen. The incisions are closed with Steri-Strips or Dermabond. You may also have square white dressings on your incisions (Primapore). You can remove these in the shower 24 hours after your surgery. You should clean your incisions with soap and water. If you go home with Steri-Strips on your incision, they will loosen and may fall off by themselves. If they haven't fallen off within 10 days, you can remove them. If you go home with Dermabond over your  sutures (stitches), it will also loosen and peel off.  What are the most common symptoms after a hysterectomy? It's common for you to have some vaginal spotting or light bleeding. You should monitor this with a pad or a panty liner. If you have having heavy bleeding (bleeding through a pad or liner every 1 to 2 hours), call your doctor right away. It's also common to have some discomfort after surgery from the air that was pumped into your abdomen during surgery. To help with this, walk, drink plenty of liquids and make sure to take the stool softeners you received.  When is it safe for me to drive? You may resume driving 2 weeks after surgery, as long as you are not taking pain medication that may make you drowsy.  When can I resume sexual activity? Do not place anything in your vagina or have vaginal intercourse for 8 weeks after your surgery. Some people will need to wait longer than 8 weeks, so speak with your doctor before resuming sexual intercourse.  Will I be able to travel? Yes, you can travel. If you are traveling by plane within a few weeks after your surgery, make sure you get up and walk every hour. Be sure to stretch your legs, drink plenty of liquids, and keep your feet elevated when possible.  Will I need any supplies? Most people do not need any supplies after the surgery. In the rare case that you do need supplies, such as tubes or drains, your nurse will order them for you.  When can I return to work? The time it takes to return to work depends on the type of work you do, the type of surgery you had, and how fast your body heals. Most people can return to work about 2 to 4 weeks after the surgery.  What exercises can I do? Exercise will help you gain strength and feel better. Walking and stair climbing are excellent forms of exercise. Gradually increase the distance you walk. Climb stairs slowly, resting or stopping as needed. Ask your doctor or nurse before starting more  strenuous exercises.  When can I lift heavy objects? Most people should not lift anything heavier than 10 pounds (4.5 kilograms) for at least 4 weeks after surgery. Speak with your doctor about when you can do heavy lifting.  How can I cope with my feelings? After surgery for  a serious illness, you may have new and upsetting feelings. Many people say they felt weepy, sad, worried, nervous, irritable, and angry at one time or another. You may find that you can't control some of these feelings. If this happens, it's a good idea to seek emotional support. The first step in coping is to talk about how you feel. Family and friends can help. Your nurse, doctor, and social worker can reassure, support, and guide you. It's always a good idea to let these professionals know how you, your family, and your friends are feeling emotionally. Many resources are available to patients and their families. Whether you're in the hospital or at home, the nurses, doctors, and social workers are here to help you and your family and friends handle the emotional aspects of your illness.  When is my first appointment after surgery? Your first appointment after surgery will be 2 to 4 weeks after surgery. Your nurse will give you instructions on how to make this appointment, including the phone number to call.  What if I have other questions? If you have any questions or concerns, please talk with your doctor or nurse. You can reach them Monday through Friday from 9:00 am to 5:00 pm. After 5:00 pm, during the weekend, and on holidays, call [phone] and ask for the doctor on call for your doctor.   Have a temperature of 101 F (38.3 C) or higher  Have pain that does not get better with pain medication  Have redness, drainage, or swelling from your incisions   Post Anesthesia Home Care Instructions  Activity: Get plenty of rest for the remainder of the day. A responsible individual must stay with you for 24 hours  following the procedure.  For the next 24 hours, DO NOT: -Drive a car -Paediatric nurse -Drink alcoholic beverages -Take any medication unless instructed by your physician -Make any legal decisions or sign important papers.  Meals: Start with liquid foods such as gelatin or soup. Progress to regular foods as tolerated. Avoid greasy, spicy, heavy foods. If nausea and/or vomiting occur, drink only clear liquids until the nausea and/or vomiting subsides. Call your physician if vomiting continues.  Special Instructions/Symptoms: Your throat may feel dry or sore from the anesthesia or the breathing tube placed in your throat during surgery. If this causes discomfort, gargle with warm salt water. The discomfort should disappear within 24 hours.  If you had a scopolamine patch placed behind your ear for the management of post- operative nausea and/or vomiting:  1. The medication in the patch is effective for 72 hours, after which it should be removed.  Wrap patch in a tissue and discard in the trash. Wash hands thoroughly with soap and water. 2. You may remove the patch earlier than 72 hours if you experience unpleasant side effects which may include dry mouth, dizziness or visual disturbances. 3. Avoid touching the patch. Wash your hands with soap and water after contact with the patch.

## 2019-04-28 NOTE — Anesthesia Procedure Notes (Signed)
Procedure Name: Intubation Date/Time: 04/28/2019 7:29 AM Performed by: Suan Halter, CRNA Pre-anesthesia Checklist: Patient identified, Emergency Drugs available, Suction available and Patient being monitored Patient Re-evaluated:Patient Re-evaluated prior to induction Oxygen Delivery Method: Circle system utilized Preoxygenation: Pre-oxygenation with 100% oxygen Induction Type: IV induction Ventilation: Mask ventilation without difficulty Laryngoscope Size: Mac and 3 Grade View: Grade I Tube type: Oral Tube size: 7.0 mm Number of attempts: 1 Airway Equipment and Method: Stylet and Oral airway Placement Confirmation: ETT inserted through vocal cords under direct vision,  positive ETCO2 and breath sounds checked- equal and bilateral Secured at: 22 cm Tube secured with: Tape Dental Injury: Teeth and Oropharynx as per pre-operative assessment

## 2019-04-28 NOTE — Interval H&P Note (Signed)
History and Physical Interval Note:  04/28/2019 6:59 AM  Gabriela Alexander  has presented today for surgery, with the diagnosis of endometrial cancer.  The various methods of treatment have been discussed with the patient and family. After consideration of risks, benefits and other options for treatment, the patient has consented to  Procedure(s): XI ROBOTIC ASSISTED TOTAL HYSTERECTOMY WITH BILATERAL SALPINGO OOPHORECTOMY (N/A) SENTINEL NODE BIOPSY (N/A) INDOCYANINE GREEN FLUORESCENCE IMAGING (ICG) (N/A) as a surgical intervention.  The patient's history has been reviewed, patient examined, no change in status, stable for surgery.  I have reviewed the patient's chart and labs.  Questions were answered to the patient's satisfaction.     Thereasa Alexander

## 2019-04-28 NOTE — Op Note (Signed)
OPERATIVE NOTE 04/28/19  Surgeon: Donaciano Eva   Assistants: Dr Lahoma Crocker (an MD assistant was necessary for tissue manipulation, management of robotic instrumentation, retraction and positioning due to the complexity of the case and hospital policies).   Anesthesia: General endotracheal anesthesia  ASA Class: 3   Pre-operative Diagnosis: endometrial cancer grade 1, morbid obesity BMI 47kg/m2  Post-operative Diagnosis: same,   Operation: Robotic-assisted laparoscopic total hysterectomy with bilateral salpingoophorectomy, SLN biopsy   Surgeon: Donaciano Eva  Assistant Surgeon: Lahoma Crocker MD  Anesthesia: GET  Urine Output: 75cc  Operative Findings:  : extreme morbid obesity, BMI 47kg/m2, Extreme morbid obesity requiring additional OR personnel for positioning and retraction. Obesity made retroperitoneal visualization limited and increased the complexity of the case and necessitated additional instrumentation for retraction. Obesity related complexity increased the duration of the procedure by 20 minutes.   Estimated Blood Loss:  20cc      Total IV Fluids: 700 ml         Specimens: uterus, cervix, bilateral tubes and ovaries, right external iliac SLN, right obturator SLN         Complications:  None; patient tolerated the procedure well.         Disposition: PACU - hemodynamically stable.  Procedure Details  The patient was seen in the Holding Room. The risks, benefits, complications, treatment options, and expected outcomes were discussed with the patient.  The patient concurred with the proposed plan, giving informed consent.  The site of surgery properly noted/marked. The patient was identified as Gabriela Alexander and the procedure verified as a Robotic-assisted hysterectomy with bilateral salpingo oophorectomy with SLN biopsy. A Time Out was held and the above information confirmed.  After induction of anesthesia, the patient was draped and prepped  in the usual sterile manner. Pt was placed in supine position after anesthesia and draped and prepped in the usual sterile manner. The abdominal drape was placed after the CholoraPrep had been allowed to dry for 3 minutes.  Her arms were tucked to her side with all appropriate precautions.  The shoulders were stabilized with padded shoulder blocks applied to the acromium processes.  The patient was placed in the semi-lithotomy position in [place].  The perineum was prepped with Betadine. The patient was then prepped. Foley catheter was placed.  A sterile speculum was placed in the vagina.  The cervix was grasped with a single-tooth tenaculum. 2mg  total of ICG was injected into the cervical stroma at 2 and 9 o'clock with 1cc injected at a 1cm and 61mm depth (concentration 0.5mg /ml) in all locations. The cervix was dilated with Kennon Rounds dilators.  The ZUMI uterine manipulator with a medium colpotomizer ring was placed without difficulty.  A pneum occluder balloon was placed over the manipulator.  OG tube placement was confirmed and to suction.   Next, a 5 mm skin incision was made 1 cm below the subcostal margin in the midclavicular line.  The 5 mm Optiview port and scope was used for direct entry.  Opening pressure was under 10 mm CO2.  The abdomen was insufflated and the findings were noted as above.   At this point and all points during the procedure, the patient's intra-abdominal pressure did not exceed 15 mmHg. Next, a 10 mm skin incision was made in the umbilicus and a right and left port was placed about 10 cm lateral to the robot port on the right and left side.  A fourth arm was placed in the left lower quadrant  2 cm above and superior and medial to the anterior superior iliac spine.  All ports were placed under direct visualization.  The patient was placed in steep Trendelenburg.  Bowel was folded away into the upper abdomen.  The robot was docked in the normal manner.  The right and left peritoneum  were opened parallel to the IP ligament to open the retroperitoneal spaces bilaterally. The SLN mapping was performed in bilateral pelvic basins. The para rectal and paravesical spaces were opened up entirely with careful dissection below the level of the ureters bilaterally and to the depth of the uterine artery origin in order to skeletonize the uterine "web" and ensure visualization of all parametrial channels. The para-aortic basins were carefully exposed and evaluated for isolated para-aortic SLN's. Lymphatic channels were identified travelling to the following visualized sentinel lymph node's: right obturator and external iliac SLN, a channel was seen on the left however this tracked to a deep common iliac SLN which could not be safely visualized due to obesity. These SLN's were separated from their surrounding lymphatic tissue, removed and sent for permanent pathology.  The hysterectomy was started after the round ligament on the right side was incised and the retroperitoneum was entered and the pararectal space was developed.  The ureter was noted to be on the medial leaf of the broad ligament.  The peritoneum above the ureter was incised and stretched and the infundibulopelvic ligament was skeletonized, cauterized and cut.  The posterior peritoneum was taken down to the level of the KOH ring.  The anterior peritoneum was also taken down.  The bladder flap was created to the level of the KOH ring.  The uterine artery on the right side was skeletonized, cauterized and cut in the normal manner.  A similar procedure was performed on the left.  The colpotomy was made and the uterus, cervix, bilateral ovaries and tubes were amputated and delivered through the vagina.  Pedicles were inspected and excellent hemostasis was achieved.    The colpotomy at the vaginal cuff was closed with Vicryl on a CT1 needle in a running manner.  Irrigation was used and excellent hemostasis was achieved.  At this point in the  procedure was completed.  Robotic instruments were removed under direct visulaization.  The robot was undocked. The 10 mm ports were closed with Vicryl on a UR-5 needle and the fascia was closed with 0 Vicryl on a UR-5 needle.  The skin was closed with 4-0 Vicryl in a subcuticular manner.  Dermabond was applied.  Sponge, lap and needle counts correct x 2.  The patient was taken to the recovery room in stable condition.  The vagina was swabbed with  minimal bleeding noted. All instrument and needle counts were correct x  3.   The patient was transferred to the recovery room in a stable condition.  Donaciano Eva, MD

## 2019-04-28 NOTE — Transfer of Care (Signed)
Immediate Anesthesia Transfer of Care Note  Patient: Phillis Knack  Procedure(s) Performed: Procedure(s) (LRB): XI ROBOTIC ASSISTED TOTAL HYSTERECTOMY WITH BILATERAL SALPINGO OOPHORECTOMY (N/A) SENTINEL NODE BIOPSY (N/A) INDOCYANINE GREEN FLUORESCENCE IMAGING (ICG) (N/A)  Patient Location: PACU  Anesthesia Type: General  Level of Consciousness: awake, oriented, sedated and patient cooperative  Airway & Oxygen Therapy: Patient Spontanous Breathing and Patient connected to face mask oxygen  Post-op Assessment: Report given to PACU RN and Post -op Vital signs reviewed and stable  Post vital signs: Reviewed and stable  Complications: No apparent anesthesia complications Last Vitals:  Vitals Value Taken Time  BP 140/81 04/28/19 0937  Temp    Pulse 80 04/28/19 0938  Resp 14 04/28/19 0939  SpO2 100 % 04/28/19 0938  Vitals shown include unvalidated device data.  Last Pain:  Vitals:   04/28/19 0639  TempSrc: Oral      Patients Stated Pain Goal: 5 (04/28/19 [id number])

## 2019-04-28 NOTE — Anesthesia Postprocedure Evaluation (Signed)
Anesthesia Post Note  Patient: Gabriela Alexander  Procedure(s) Performed: XI ROBOTIC ASSISTED TOTAL HYSTERECTOMY WITH BILATERAL SALPINGO OOPHORECTOMY (N/A ) SENTINEL NODE BIOPSY (N/A ) INDOCYANINE GREEN FLUORESCENCE IMAGING (ICG) (N/A )     Patient location during evaluation: PACU Anesthesia Type: General Level of consciousness: awake and alert Pain management: pain level controlled Vital Signs Assessment: post-procedure vital signs reviewed and stable Respiratory status: spontaneous breathing, nonlabored ventilation, respiratory function stable and patient connected to nasal cannula oxygen Cardiovascular status: blood pressure returned to baseline and stable Postop Assessment: no apparent nausea or vomiting Anesthetic complications: no    Last Vitals:  Vitals:   04/28/19 0945 04/28/19 1000  BP: (!) 147/80 (!) 143/84  Pulse: 77 72  Resp: 15 11  Temp:    SpO2: 100% 100%    Last Pain:  Vitals:   04/28/19 1015  TempSrc:   PainSc: 3                  Cashis Rill S

## 2019-04-28 NOTE — Anesthesia Preprocedure Evaluation (Signed)
Anesthesia Evaluation  Patient identified by MRN, date of birth, ID band Patient awake    Reviewed: Allergy & Precautions, NPO status , Patient's Chart, lab work & pertinent test results  History of Anesthesia Complications (+) PONV  Airway Mallampati: II  TM Distance: >3 FB Neck ROM: Full    Dental no notable dental hx.    Pulmonary neg pulmonary ROS,    Pulmonary exam normal breath sounds clear to auscultation       Cardiovascular hypertension, Normal cardiovascular exam Rhythm:Regular Rate:Normal     Neuro/Psych negative neurological ROS  negative psych ROS   GI/Hepatic negative GI ROS, Neg liver ROS,   Endo/Other  Morbid obesity  Renal/GU negative Renal ROS  negative genitourinary   Musculoskeletal negative musculoskeletal ROS (+)   Abdominal   Peds negative pediatric ROS (+)  Hematology negative hematology ROS (+)   Anesthesia Other Findings   Reproductive/Obstetrics negative OB ROS                             Anesthesia Physical Anesthesia Plan  ASA: II  Anesthesia Plan: General   Post-op Pain Management:    Induction: Intravenous  PONV Risk Score and Plan: 4 or greater and Scopolamine patch - Pre-op, Midazolam, Dexamethasone, Ondansetron and Treatment may vary due to age or medical condition  Airway Management Planned: Oral ETT  Additional Equipment:   Intra-op Plan:   Post-operative Plan: Extubation in OR  Informed Consent: I have reviewed the patients History and Physical, chart, labs and discussed the procedure including the risks, benefits and alternatives for the proposed anesthesia with the patient or authorized representative who has indicated his/her understanding and acceptance.     Dental advisory given  Plan Discussed with: CRNA and Surgeon  Anesthesia Plan Comments:         Anesthesia Quick Evaluation

## 2019-04-29 ENCOUNTER — Encounter (HOSPITAL_BASED_OUTPATIENT_CLINIC_OR_DEPARTMENT_OTHER): Payer: Self-pay | Admitting: Gynecologic Oncology

## 2019-04-29 ENCOUNTER — Telehealth: Payer: Self-pay

## 2019-04-29 NOTE — Telephone Encounter (Signed)
Gabriela Alexander states that she is doing well. Abdomen a little sore. Incisions D&I today. One incision oozed a little yesterday. She is eating, drinking,and urinating well. Passing gas. She tends to have problems with constipation. Suggested she increase the senokot-S to bid.  If no BM by tomorrow mid morning add a capful of Miralax bid prn.  If no BM by Sunday morning she could take a Ducolax tablet. She is aware of her up coming appointments and has the office number [phone] to call if any questions or concerns.

## 2019-05-04 ENCOUNTER — Telehealth: Payer: Self-pay

## 2019-05-04 ENCOUNTER — Other Ambulatory Visit: Payer: Self-pay

## 2019-05-04 ENCOUNTER — Other Ambulatory Visit: Payer: Self-pay | Admitting: Gynecologic Oncology

## 2019-05-04 ENCOUNTER — Inpatient Hospital Stay: Payer: Federal, State, Local not specified - PPO | Attending: Gynecologic Oncology

## 2019-05-04 DIAGNOSIS — Z6841 Body Mass Index (BMI) 40.0 and over, adult: Secondary | ICD-10-CM | POA: Diagnosis not present

## 2019-05-04 DIAGNOSIS — Z90722 Acquired absence of ovaries, bilateral: Secondary | ICD-10-CM | POA: Insufficient documentation

## 2019-05-04 DIAGNOSIS — Z90710 Acquired absence of both cervix and uterus: Secondary | ICD-10-CM | POA: Insufficient documentation

## 2019-05-04 DIAGNOSIS — C541 Malignant neoplasm of endometrium: Secondary | ICD-10-CM | POA: Diagnosis not present

## 2019-05-04 DIAGNOSIS — G932 Benign intracranial hypertension: Secondary | ICD-10-CM | POA: Diagnosis not present

## 2019-05-04 DIAGNOSIS — F329 Major depressive disorder, single episode, unspecified: Secondary | ICD-10-CM | POA: Diagnosis not present

## 2019-05-04 DIAGNOSIS — Z7951 Long term (current) use of inhaled steroids: Secondary | ICD-10-CM | POA: Insufficient documentation

## 2019-05-04 DIAGNOSIS — N390 Urinary tract infection, site not specified: Secondary | ICD-10-CM

## 2019-05-04 DIAGNOSIS — Z79899 Other long term (current) drug therapy: Secondary | ICD-10-CM | POA: Insufficient documentation

## 2019-05-04 DIAGNOSIS — I10 Essential (primary) hypertension: Secondary | ICD-10-CM | POA: Insufficient documentation

## 2019-05-04 DIAGNOSIS — B009 Herpesviral infection, unspecified: Secondary | ICD-10-CM | POA: Diagnosis not present

## 2019-05-04 DIAGNOSIS — R300 Dysuria: Secondary | ICD-10-CM

## 2019-05-04 DIAGNOSIS — J45909 Unspecified asthma, uncomplicated: Secondary | ICD-10-CM | POA: Diagnosis not present

## 2019-05-04 LAB — URINALYSIS, COMPLETE (UACMP) WITH MICROSCOPIC
Bilirubin Urine: NEGATIVE
Glucose, UA: NEGATIVE mg/dL
Hgb urine dipstick: NEGATIVE
Ketones, ur: NEGATIVE mg/dL
Nitrite: NEGATIVE
Protein, ur: NEGATIVE mg/dL
Specific Gravity, Urine: 1.012 (ref 1.005–1.030)
WBC, UA: >50 WBC/hpf — ABNORMAL HIGH (ref 0–5)
pH: 7 (ref 5.0–8.0)

## 2019-05-04 LAB — SURGICAL PATHOLOGY

## 2019-05-04 MED ORDER — CIPROFLOXACIN HCL 250 MG PO TABS: 250.0000 mg | ORAL_TABLET | Freq: Two times a day (BID) | ORAL | 0 refills | Status: DC

## 2019-05-04 NOTE — Telephone Encounter (Signed)
Told [name] that the UA does look like she has a UTI. Joylene John, NP sent in Cipro 250 mg bid for 3 days.  Will see if ATB needs to be changed if organism is not sensitive to cipro. Discussed main side effects of cipro as being ruptured Achilles tendon and symptoms of not feeling right mentally.  She needs to stop the cipro and call the office if any symptoms arise. Pt verbalized understanding.

## 2019-05-04 NOTE — Telephone Encounter (Signed)
Gabriela Alexander states that since she finished her Macrobid a couple a couple of days ago for UTI pre op she has begun with burning and pain with urination the las couple of days. Her urine is cloudy.  Afebrile. She will come in for a UA, C&Sat 3 pm today.

## 2019-05-06 ENCOUNTER — Encounter: Payer: Self-pay | Admitting: Gynecologic Oncology

## 2019-05-06 ENCOUNTER — Inpatient Hospital Stay (HOSPITAL_BASED_OUTPATIENT_CLINIC_OR_DEPARTMENT_OTHER): Payer: Federal, State, Local not specified - PPO | Admitting: Gynecologic Oncology

## 2019-05-06 ENCOUNTER — Other Ambulatory Visit: Payer: Self-pay

## 2019-05-06 DIAGNOSIS — C541 Malignant neoplasm of endometrium: Secondary | ICD-10-CM

## 2019-05-06 DIAGNOSIS — Z7189 Other specified counseling: Secondary | ICD-10-CM

## 2019-05-06 LAB — URINE CULTURE: Culture: >=100000 — AB

## 2019-05-06 NOTE — Telephone Encounter (Signed)
I spoke with Gabriela Alexander this am.  I let her know the urine culture showed sensitivity to cipro and that she should finish the prescription.  She said that her symptoms are less.  She is drinking water to flush her bladder.  I encouraged her to also drink cranberry juice.  I told her to call if the symptoms got worse.  She verbalized understanding.

## 2019-05-06 NOTE — Progress Notes (Signed)
Gynecologic Oncology Telehealth Follow-up Note: Gyn-Onc  I connected with Gabriela Alexander on 05/06/19 at  4:00 PM EST by telephone and verified that I am speaking with the correct person using two identifiers.  I discussed the limitations, risks, security and privacy concerns of performing an evaluation and management service by telemedicine and the availability of in-person appointments. I also discussed with the patient that there may be a patient responsible charge related to this service. The patient expressed understanding and agreed to proceed.  Other persons participating in the visit and their role in the encounter: none.  Patient's location: home Provider's location: self  Chief Complaint:  Chief Complaint  Patient presents with  . endometrial cancer    Assessment/Plan:  [name]  is a 48 y.o.  year old with stage IA grade 1 endometrioid endometrial cancer (MSI high/MMR normal).  Pathology revealed low risk factors for recurrence, therefore no adjuvant therapy is recommended according to NCCN guidelines.  I discussed risk for recurrence and typical symptoms encouraged her to notify us of these should they develop between visits.  I recommend she have follow-up every 6 months for 5 years in accordance with NCCN guidelines. Those visits should include symptom assessment, physical exam and pelvic examination. Pap smears are not indicated or recommended in the routine surveillance of endometrial cancer.  HPI: [name] is a 48 year old P2 who is seen in consultation at the request of Dr Gabriela Alexander for FIGO grade 1 endometrioid endometrial adenocarcinoma in the setting of morbid obesity.   The patient reported irregular menstrual periods for approximately 3 years.  These would include frequently spaced menses as well as periods of oligomenorrhea including 18-week spans of no menstrual bleeding.  She mentioned this to her primary care physician who ordered a transvaginal ultrasound on  January 11, 2019.  This revealed a uterus measuring 11.8 x 8 x 4.1 cm with an endometrial thickness of 15 mm.  The right ovary was not visualized due to body habitus and the left ovary measured 2.2 x 1.3 x 1.7 cm with hypoechoic area seen which may represent an ovarian cyst.  Due to the upper limit of normal endometrium and her abnormal uterine bleeding symptoms she was referred to Dr. Landry Alexander.  Dr. Landry Alexander saw the patient on March 18, 2019 and performed an endometrial biopsy at that visit which revealed FIGO grade 1 endometrial adenocarcinoma with extensive complex atypical hyperplasia.  .  The patient otherwise has a medical history significant for morbid obesity.  Her BMI is 45 kg meters squared.  She has hypertension and asthma.  She denies a history of diabetes mellitus.  She takes amitriptyline for a left foot injury with associated numbness on the sole of her left foot.  She has a remote history of pseudotumor cerebri, however has no headaches associated with this.  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  Her prior abdominal surgeries include 2 prior cesarean sections 1 which included a tubal ligation.  Additionally she had the tumor from the lateral side of her head.  She has had no vaginal births.  She had no history of abnormal Pap smears and her last normal Pap smear was in January 2019 was normal.  She has no family history of malignancy.  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  She lives with her husband and her adult daughter.  Interval  Hx:  On 04/28/19 she underwent a robotic assisted total hysterectomy with BSO and sentinel lymph node biopsy (unilateral to the right).  Surgery was complicated by morbid obesity making retroperitoneal visualization limited.  Final pathology was resulted as a FIGO stage Ia grade 1 endometrioid endometrial adenocarcinoma.  There were 2 of 20 mm depth of  invasion (inner half myometrial invasion) with no LVSI identified.  The adnexa, cervix, and sentinel lymph nodes were all negative for metastatic disease.  MMR normal, MSI stable.  She was determined to have low risk disease for recurrence and was recommended close follow-up but no adjuvant therapy in accordance with NCCN guidelines.  She is done well since surgery with no specific complaints.  Current Meds:  Outpatient Encounter Medications as of 05/06/2019  Medication Sig  . acetaminophen (TYLENOL) 650 MG CR tablet Take 650 mg by mouth daily.  Marland Kitchen albuterol (PROVENTIL HFA;VENTOLIN HFA) 108 (90 Base) MCG/ACT inhaler Inhale 1-2 puffs into the lungs every 6 (six) hours as needed for wheezing or shortness of breath.  Marland Kitchen amitriptyline (ELAVIL) 25 MG tablet Take 75 mg by mouth at bedtime. For shooting leg pain.  . bisacodyl (BISACODYL) 5 MG EC tablet Take 5 mg by mouth daily as needed for moderate constipation.  . chlorthalidone (HYGROTON) 25 MG tablet Take 25 mg by mouth daily.  . ciprofloxacin (CIPRO) 250 MG tablet Take 1 tablet (250 mg total) by mouth 2 (two) times daily.  . DULERA 100-5 MCG/ACT AERO Inhale 2 puffs into the lungs 2 (two) times daily.   . ergocalciferol (VITAMIN D2) 1.25 MG (50000 UT) capsule Take 50,000 Units by mouth every Friday.   . Ferrous Sulfate (SLOW FE PO) Take 1 tablet by mouth daily.   . Ferrous Sulfate (SLOW FE) 142 (45 Fe) MG TBCR Take 45 mg by mouth daily.  Marland Kitchen lidocaine (LIDODERM) 5 % Place 1 patch onto the skin daily as needed for pain.  . megestrol (MEGACE) 40 MG tablet Take 40 mg by mouth every evening.   . montelukast (SINGULAIR) 10 MG tablet Take 10 mg by mouth every evening.   . naproxen (NAPROSYN) 500 MG tablet Take 500 mg by mouth 2 (two) times daily.  . nitrofurantoin, macrocrystal-monohydrate, (MACROBID) 100 MG capsule Take 1 capsule (100 mg total) by mouth 2 (two) times daily.  Marland Kitchen oxyCODONE (OXY IR/ROXICODONE) 5 MG immediate release tablet Take 1 tablet (5  mg total) by mouth every 4 (four) hours as needed for severe pain. For AFTER surgery, do not take and drive  . potassium chloride SA (KLOR-CON) 20 MEQ tablet Take 1 tablet (20 mEq total) by mouth 2 (two) times daily for 3 doses.  . Probiotic Product (PROBIOTIC PO) Take 1 capsule by mouth daily.  Marland Kitchen senna-docusate (SENOKOT-S) 8.6-50 MG tablet Take 2 tablets by mouth at bedtime. For AFTER surgery, do not take if having diarrhea  . verapamil (CALAN-SR) 240 MG CR tablet Take 240 mg by mouth at bedtime.   No facility-administered encounter medications on file as of 05/06/2019.     Allergy:  Allergies  Allergen Reactions  . Prednisone Other (See Comments)    "feels like a raving lunatic...angry"  . Latex Rash    Hot to the touch.     Social Hx:   Social History   Socioeconomic History  . Marital status: Married    Spouse name: Not on file  . Number of children: Not on file  . Years of education: Not on file  . Highest education level:  Not on file  Occupational History  . Not on file  Social Needs  . Financial resource strain: Not on file  . Food insecurity    Worry: Not on file    Inability: Not on file  . Transportation needs    Medical: Not on file    Non-medical: Not on file  Tobacco Use  . Smoking status: Never Smoker  . Smokeless tobacco: Never Used  Substance and Sexual Activity  . Alcohol use: No  . Drug use: Never  . Sexual activity: Yes  Lifestyle  . Physical activity    Days per week: Not on file    Minutes per session: Not on file  . Stress: Not on file  Relationships  . Social Herbalist on phone: Not on file    Gets together: Not on file    Attends religious service: Not on file    Active member of club or organization: Not on file    Attends meetings of clubs or organizations: Not on file    Relationship status: Not on file  . Intimate partner violence    Fear of current or ex partner: Not on file    Emotionally abused: Not on file     Physically abused: Not on file    Forced sexual activity: Not on file  Other Topics Concern  . Not on file  Social History Narrative  . Not on file    Past Surgical Hx:  Past Surgical History:  Procedure Laterality Date  . CESAREAN SECTION    . genital herpes N/A   . LAPAROSCOPIC TUBAL LIGATION Bilateral   . OTHER SURGICAL HISTORY Right    Benign tumor removed from right side of head at age 12  . ROBOTIC ASSISTED TOTAL HYSTERECTOMY WITH BILATERAL SALPINGO OOPHERECTOMY N/A 04/28/2019   Procedure: XI ROBOTIC ASSISTED TOTAL HYSTERECTOMY WITH BILATERAL SALPINGO OOPHORECTOMY;  Surgeon: Everitt Amber, MD;  Location: [facility];  Service: Gynecology;  Laterality: N/A;  . SENTINEL NODE BIOPSY N/A 04/28/2019   Procedure: SENTINEL NODE BIOPSY;  Surgeon: Everitt Amber, MD;  Location: [facility];  Service: Gynecology;  Laterality: N/A;    Past Medical Hx:  Past Medical History:  Diagnosis Date  . Allergic rhinitis   . Asthma   . Complication of anesthesia    Unable to feel extremities for a long time after surgery --25 years ago  . Depression   . Genital herpes   . Hypertension   . Insomnia   . Migraine   . Neuropathy   . PONV (postoperative nausea and vomiting)   . Pseudotumor cerebri     Past Gynecological History:  C/s x2  No LMP recorded. (Menstrual status: Perimenopausal).  Family Hx:  Family History  Problem Relation Age of Onset  . High Cholesterol Mother   . Prostate cancer Father   . High Cholesterol Father   . Arthritis Father   . Coronary artery disease Maternal Grandfather   . Stroke Paternal Grandmother   . Macular degeneration Maternal Grandmother   . Ovarian cancer Neg Hx   . Endometrial cancer Neg Hx     Review of Systems:  Constitutional  Feels well,    ENT Normal appearing ears and nares bilaterally Skin/Breast  No rash, sores, jaundice, itching, dryness Cardiovascular  No chest pain, shortness of breath, or edema   Pulmonary  No cough or wheeze.  Gastro Intestinal  No nausea, vomitting, or diarrhoea. No bright red blood per  rectum, no abdominal pain, change in bowel movement, or constipation.  Genito Urinary  No frequency, urgency, dysuria, + abnormal uterine bleeding Musculo Skeletal  No myalgia, arthralgia, joint swelling or pain  Neurologic  No weakness, numbness, change in gait,  Psychology  No depression, anxiety, insomnia.   Vitals:  There were no vitals taken for this visit.  Physical Exam: Deferred  I discussed the assessment and treatment plan with the patient. The patient was provided with an opportunity to ask questions and all were answered. The patient agreed with the plan and demonstrated an understanding of the instructions.   The patient was advised to call back or see an in-person evaluation if the symptoms worsen or if the condition fails to improve as anticipated.   I provided 10 minutes of non face-to-face telephone visit time during this encounter, and > 50% was spent counseling as documented under my assessment & plan.   Thereasa Solo, MD  05/06/2019, 4:31 PM

## 2019-05-23 ENCOUNTER — Other Ambulatory Visit: Payer: Self-pay

## 2019-05-23 ENCOUNTER — Encounter: Payer: Self-pay | Admitting: Gynecologic Oncology

## 2019-05-23 ENCOUNTER — Inpatient Hospital Stay (HOSPITAL_BASED_OUTPATIENT_CLINIC_OR_DEPARTMENT_OTHER): Payer: Federal, State, Local not specified - PPO | Admitting: Gynecologic Oncology

## 2019-05-23 VITALS — BP 136/83 | HR 96 | Temp 97.8°F | Resp 20 | Ht 66.5 in | Wt 281.4 lb

## 2019-05-23 DIAGNOSIS — F329 Major depressive disorder, single episode, unspecified: Secondary | ICD-10-CM | POA: Diagnosis not present

## 2019-05-23 DIAGNOSIS — C541 Malignant neoplasm of endometrium: Secondary | ICD-10-CM

## 2019-05-23 DIAGNOSIS — Z7189 Other specified counseling: Secondary | ICD-10-CM

## 2019-05-23 DIAGNOSIS — Z6841 Body Mass Index (BMI) 40.0 and over, adult: Secondary | ICD-10-CM | POA: Diagnosis not present

## 2019-05-23 DIAGNOSIS — I10 Essential (primary) hypertension: Secondary | ICD-10-CM | POA: Diagnosis not present

## 2019-05-23 DIAGNOSIS — Z7951 Long term (current) use of inhaled steroids: Secondary | ICD-10-CM | POA: Diagnosis not present

## 2019-05-23 DIAGNOSIS — Z79899 Other long term (current) drug therapy: Secondary | ICD-10-CM | POA: Diagnosis not present

## 2019-05-23 DIAGNOSIS — B009 Herpesviral infection, unspecified: Secondary | ICD-10-CM | POA: Diagnosis not present

## 2019-05-23 DIAGNOSIS — J45909 Unspecified asthma, uncomplicated: Secondary | ICD-10-CM | POA: Diagnosis not present

## 2019-05-23 DIAGNOSIS — Z90722 Acquired absence of ovaries, bilateral: Secondary | ICD-10-CM | POA: Diagnosis not present

## 2019-05-23 DIAGNOSIS — Z90710 Acquired absence of both cervix and uterus: Secondary | ICD-10-CM | POA: Diagnosis not present

## 2019-05-23 DIAGNOSIS — G932 Benign intracranial hypertension: Secondary | ICD-10-CM | POA: Diagnosis not present

## 2019-05-23 NOTE — Progress Notes (Signed)
Gynecologic Oncology Follow-up Note  Chief Complaint:  Chief Complaint  Patient presents with  . endometrial cancer  . Counseling and coordination of care    Assessment/Plan:  [name]  is a 48 y.o.  year old with stage IA grade 1 endometrioid endometrial cancer (MSI high/MMR normal).  Pathology revealed low risk factors for recurrence, therefore no adjuvant therapy is recommended according to NCCN guidelines.  I discussed risk for recurrence and typical symptoms encouraged her to notify us of these should they develop between visits.  I recommend she have follow-up every 6 months for 5 years in accordance with NCCN guidelines. Those visits should include symptom assessment, physical exam and pelvic examination. Pap smears are not indicated or recommended in the routine surveillance of endometrial cancer.  She has some redness around her right lateral incision. It does not appear infected though I recommended she monitor it for spreading erythema.   HPI: [name] is a 48 year old P2 who is seen in consultation at the request of Dr Landry Mellow for FIGO grade 1 endometrioid endometrial adenocarcinoma in the setting of morbid obesity.   The patient reported irregular menstrual periods for approximately 3 years.  These would include frequently spaced menses as well as periods of oligomenorrhea including 18-week spans of no menstrual bleeding.  She mentioned this to her primary care physician who ordered a transvaginal ultrasound on January 11, 2019.  This revealed a uterus measuring 11.8 x 8 x 4.1 cm with an endometrial thickness of 15 mm.  The right ovary was not visualized due to body habitus and the left ovary measured 2.2 x 1.3 x 1.7 cm with hypoechoic area seen which may represent an ovarian cyst.  Due to the upper limit of normal endometrium and her abnormal uterine bleeding symptoms she was referred to Dr. Landry Mellow.  Dr. Landry Mellow saw the patient on March 18, 2019 and performed an endometrial  biopsy at that visit which revealed FIGO grade 1 endometrial adenocarcinoma with extensive complex atypical hyperplasia.  .  The patient otherwise has a medical history significant for morbid obesity.  Her BMI is 45 kg meters squared.  She has hypertension and asthma.  She denies a history of diabetes mellitus.  She takes amitriptyline for a left foot injury with associated numbness on the sole of her left foot.  She has a remote history of pseudotumor cerebri, however has no headaches associated with this.  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  Her prior abdominal surgeries include 2 prior cesarean sections 1 which included a tubal ligation.  Additionally she had the tumor from the lateral side of her head.  She has had no vaginal births.  She had no history of abnormal Pap smears and her last normal Pap smear was in January 2019 was normal.  She has no family history of malignancy.  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  She lives with her husband and her adult daughter.  Interval Hx:  On 04/28/19 she underwent a robotic assisted total hysterectomy with BSO and sentinel lymph node biopsy (unilateral to the right).  Surgery was complicated by morbid obesity making retroperitoneal visualization limited.  Final pathology was resulted as a FIGO stage Ia grade 1 endometrioid endometrial adenocarcinoma.  There were 2 of 20 mm depth of invasion (inner half myometrial invasion) with no LVSI identified.  The adnexa, cervix,  and sentinel lymph nodes were all negative for metastatic disease.  MMR normal, MSI stable.  She was determined to have low risk disease for recurrence and was recommended close follow-up but no adjuvant therapy in accordance with NCCN guidelines.  She is done well since surgery with no specific complaints.  Current Meds:  Outpatient Encounter Medications as of 05/23/2019   Medication Sig  . acetaminophen (TYLENOL) 650 MG CR tablet Take 650 mg by mouth daily.  Marland Kitchen albuterol (PROVENTIL HFA;VENTOLIN HFA) 108 (90 Base) MCG/ACT inhaler Inhale 1-2 puffs into the lungs every 6 (six) hours as needed for wheezing or shortness of breath.  Marland Kitchen amitriptyline (ELAVIL) 25 MG tablet Take 75 mg by mouth at bedtime. For shooting leg pain.  . bisacodyl (BISACODYL) 5 MG EC tablet Take 5 mg by mouth daily as needed for moderate constipation.  . chlorthalidone (HYGROTON) 25 MG tablet Take 25 mg by mouth daily.  . ciprofloxacin (CIPRO) 250 MG tablet Take 1 tablet (250 mg total) by mouth 2 (two) times daily.  . DULERA 100-5 MCG/ACT AERO Inhale 2 puffs into the lungs 2 (two) times daily.   . ergocalciferol (VITAMIN D2) 1.25 MG (50000 UT) capsule Take 50,000 Units by mouth every Friday.   . Ferrous Sulfate (SLOW FE PO) Take 1 tablet by mouth daily.   . Ferrous Sulfate (SLOW FE) 142 (45 Fe) MG TBCR Take 45 mg by mouth daily.  Marland Kitchen lidocaine (LIDODERM) 5 % Place 1 patch onto the skin daily as needed for pain.  . megestrol (MEGACE) 40 MG tablet Take 40 mg by mouth every evening.   . montelukast (SINGULAIR) 10 MG tablet Take 10 mg by mouth every evening.   . naproxen (NAPROSYN) 500 MG tablet Take 500 mg by mouth 2 (two) times daily.  . nitrofurantoin, macrocrystal-monohydrate, (MACROBID) 100 MG capsule Take 1 capsule (100 mg total) by mouth 2 (two) times daily.  . Probiotic Product (PROBIOTIC PO) Take 1 capsule by mouth daily.  . verapamil (CALAN-SR) 240 MG CR tablet Take 240 mg by mouth at bedtime.  . potassium chloride SA (KLOR-CON) 20 MEQ tablet Take 1 tablet (20 mEq total) by mouth 2 (two) times daily for 3 doses.  . [DISCONTINUED] oxyCODONE (OXY IR/ROXICODONE) 5 MG immediate release tablet Take 1 tablet (5 mg total) by mouth every 4 (four) hours as needed for severe pain. For AFTER surgery, do not take and drive  . [DISCONTINUED] senna-docusate (SENOKOT-S) 8.6-50 MG tablet Take 2 tablets by  mouth at bedtime. For AFTER surgery, do not take if having diarrhea   No facility-administered encounter medications on file as of 05/23/2019.     Allergy:  Allergies  Allergen Reactions  . Prednisone Other (See Comments)    "feels like a raving lunatic...angry"  . Latex Rash    Hot to the touch.     Social Hx:   Social History   Socioeconomic History  . Marital status: Married    Spouse name: Not on file  . Number of children: Not on file  . Years of education: Not on file  . Highest education level: Not on file  Occupational History  . Not on file  Social Needs  . Financial resource strain: Not on file  . Food insecurity    Worry: Not on file    Inability: Not on file  . Transportation needs    Medical: Not on file    Non-medical: Not on file  Tobacco Use  . Smoking status: Never Smoker  .  Smokeless tobacco: Never Used  Substance and Sexual Activity  . Alcohol use: No  . Drug use: Never  . Sexual activity: Yes  Lifestyle  . Physical activity    Days per week: Not on file    Minutes per session: Not on file  . Stress: Not on file  Relationships  . Social Herbalist on phone: Not on file    Gets together: Not on file    Attends religious service: Not on file    Active member of club or organization: Not on file    Attends meetings of clubs or organizations: Not on file    Relationship status: Not on file  . Intimate partner violence    Fear of current or ex partner: Not on file    Emotionally abused: Not on file    Physically abused: Not on file    Forced sexual activity: Not on file  Other Topics Concern  . Not on file  Social History Narrative  . Not on file    Past Surgical Hx:  Past Surgical History:  Procedure Laterality Date  . CESAREAN SECTION    . genital herpes N/A   . LAPAROSCOPIC TUBAL LIGATION Bilateral   . OTHER SURGICAL HISTORY Right    Benign tumor removed from right side of head at age 28  . ROBOTIC ASSISTED TOTAL  HYSTERECTOMY WITH BILATERAL SALPINGO OOPHERECTOMY N/A 04/28/2019   Procedure: XI ROBOTIC ASSISTED TOTAL HYSTERECTOMY WITH BILATERAL SALPINGO OOPHORECTOMY;  Surgeon: Everitt Amber, MD;  Location: [facility];  Service: Gynecology;  Laterality: N/A;  . SENTINEL NODE BIOPSY N/A 04/28/2019   Procedure: SENTINEL NODE BIOPSY;  Surgeon: Everitt Amber, MD;  Location[facility];  Service: Gynecology;  Laterality: N/A;    Past Medical Hx:  Past Medical History:  Diagnosis Date  . Allergic rhinitis   . Asthma   . Complication of anesthesia    Unable to feel extremities for a long time after surgery --25 years ago  . Depression   . Genital herpes   . Hypertension   . Insomnia   . Migraine   . Neuropathy   . PONV (postoperative nausea and vomiting)   . Pseudotumor cerebri     Past Gynecological History:  C/s x2  No LMP recorded. (Menstrual status: Perimenopausal).  Family Hx:  Family History  Problem Relation Age of Onset  . High Cholesterol Mother   . Prostate cancer Father   . High Cholesterol Father   . Arthritis Father   . Coronary artery disease Maternal Grandfather   . Stroke Paternal Grandmother   . Macular degeneration Maternal Grandmother   . Ovarian cancer Neg Hx   . Endometrial cancer Neg Hx     Review of Systems:  Constitutional  Feels well,    ENT Normal appearing ears and nares bilaterally Skin/Breast  No rash, sores, jaundice, itching, dryness Cardiovascular  No chest pain, shortness of breath, or edema  Pulmonary  No cough or wheeze.  Gastro Intestinal  No nausea, vomitting, or diarrhoea. No bright red blood per rectum, no abdominal pain, change in bowel movement, or constipation.  Genito Urinary  No frequency, urgency, dysuria, + abnormal uterine bleeding Musculo Skeletal  No myalgia, arthralgia, joint swelling or pain  Neurologic  No weakness, numbness, change in gait,  Psychology  No depression, anxiety, insomnia.   Vitals:   Blood pressure 136/83, pulse 96, temperature 97.8 F (36.6 C), temperature source Temporal, resp. rate 20, height  5' 6.5" (1.689 m), weight 281 lb 6 oz (127.6 kg), SpO2 100 %.  Physical Exam: WD in NAD Neck  Supple NROM, without any enlargements.  Lymph Node Survey No cervical supraclavicular or inguinal adenopathy Cardiovascular  Pulse normal rate, regularity and rhythm. S1 and S2 normal.  Lungs  Clear to auscultation bilateraly, without wheezes/crackles/rhonchi. Good air movement.  Skin  No rash/lesions/breakdown  Psychiatry  Alert and oriented to person, place, and time  Abdomen  Normoactive bowel sounds, abdomen soft, non-tender and obese without evidence of hernia. Incisions healing well. Right lateral incision slightly opened at the epithelium with some erythema to surrounding skin but no purulence or drainage or obvious cellulitis.  Back No CVA tenderness Genito Urinary  Vaginal cuff: in tact, no blood Bimanual: no masses Rectal  Good tone, no masses no cul de sac nodularity.  Extremities  No bilateral cyanosis, clubbing or edema.  20 minutes of direct face to face counseling time was spent with the patient. This included discussion about prognosis, therapy recommendations and postoperative side effects and are beyond the scope of routine postoperative care.   Thereasa Solo, MD  05/23/2019, 3:57 PM

## 2019-05-23 NOTE — Patient Instructions (Signed)
Mark around your incision with a pen or marker. If the redness extends beyond the rim that you mark, notify Dr Serita Grit office at [phone].  Please notify Dr Denman George at phone number [phone] if you notice vaginal bleeding, new pelvic or abdominal pains, bloating, feeling full easy, or a change in bladder or bowel function.   Please return to see Dr Denman George in May, 2021 as scheduled.   Your cancer was a stage IA cancer and therefore because there was no signs of spread or high risk features, no further treatment is recommended.

## 2019-06-10 DIAGNOSIS — G603 Idiopathic progressive neuropathy: Secondary | ICD-10-CM | POA: Diagnosis not present

## 2019-06-10 DIAGNOSIS — K5909 Other constipation: Secondary | ICD-10-CM | POA: Diagnosis not present

## 2019-06-10 DIAGNOSIS — G43009 Migraine without aura, not intractable, without status migrainosus: Secondary | ICD-10-CM | POA: Diagnosis not present

## 2019-06-10 DIAGNOSIS — E559 Vitamin D deficiency, unspecified: Secondary | ICD-10-CM | POA: Diagnosis not present

## 2019-07-01 DIAGNOSIS — Z6841 Body Mass Index (BMI) 40.0 and over, adult: Secondary | ICD-10-CM | POA: Diagnosis not present

## 2019-07-01 DIAGNOSIS — G479 Sleep disorder, unspecified: Secondary | ICD-10-CM | POA: Diagnosis not present

## 2019-07-01 DIAGNOSIS — J45909 Unspecified asthma, uncomplicated: Secondary | ICD-10-CM | POA: Diagnosis not present

## 2019-07-01 DIAGNOSIS — I10 Essential (primary) hypertension: Secondary | ICD-10-CM | POA: Diagnosis not present

## 2019-08-27 ENCOUNTER — Ambulatory Visit: Payer: Federal, State, Local not specified - PPO | Attending: Internal Medicine

## 2019-08-27 DIAGNOSIS — Z23 Encounter for immunization: Secondary | ICD-10-CM | POA: Insufficient documentation

## 2019-08-27 NOTE — Progress Notes (Signed)
   Covid-19 Vaccination Clinic  Name:  Antha Dosier    MRN: [id number] DOB: August 09, 1970  08/27/2019  Ms. Nunnelley was observed post Covid-19 immunization for 15 minutes without incident. She was provided with Vaccine Information Sheet and instruction to access the V-Safe system.   Ms. Deutsch was instructed to call 911 with any severe reactions post vaccine: Marland Kitchen Difficulty breathing  . Swelling of face and throat  . A fast heartbeat  . A bad rash all over body  . Dizziness and weakness   Immunizations Administered    Name Date Dose VIS Date Route   Pfizer COVID-19 Vaccine 08/27/2019  2:06 PM 0.3 mL 06/03/2019 Intramuscular   Manufacturer: [place]   Lot: [id number]   [place]: [id number]

## 2019-08-29 DIAGNOSIS — Z1211 Encounter for screening for malignant neoplasm of colon: Secondary | ICD-10-CM | POA: Diagnosis not present

## 2019-09-17 ENCOUNTER — Ambulatory Visit: Payer: Federal, State, Local not specified - PPO | Attending: Internal Medicine

## 2019-09-17 DIAGNOSIS — Z23 Encounter for immunization: Secondary | ICD-10-CM

## 2019-09-17 NOTE — Progress Notes (Signed)
   Covid-19 Vaccination Clinic  Name:  Gabriela Alexander    MRN: [id number] DOB: 1971-03-16  09/17/2019  [name] was observed post Covid-19 immunization for 30 minutes based on pre-vaccination screening without incident. She was provided with Vaccine Information Sheet and instruction to access the V-Safe system.   [name] was instructed to call 911 with any severe reactions post vaccine: Marland Kitchen Difficulty breathing  . Swelling of face and throat  . A fast heartbeat  . A bad rash all over body  . Dizziness and weakness   Immunizations Administered    Name Date Dose VIS Date Route   Pfizer COVID-19 Vaccine 09/17/2019  2:09 PM 0.3 mL 06/03/2019 Intramuscular   Manufacturer: [place]   Lot: [id number]   [place]: [id number]

## 2019-11-15 ENCOUNTER — Ambulatory Visit: Payer: Federal, State, Local not specified - PPO | Admitting: Gynecologic Oncology

## 2019-11-24 ENCOUNTER — Inpatient Hospital Stay: Payer: Federal, State, Local not specified - PPO | Attending: Gynecologic Oncology | Admitting: Gynecologic Oncology

## 2019-11-24 ENCOUNTER — Encounter: Payer: Self-pay | Admitting: Gynecologic Oncology

## 2019-11-24 ENCOUNTER — Other Ambulatory Visit: Payer: Self-pay

## 2019-11-24 VITALS — BP 120/81 | HR 85 | Temp 97.1°F | Resp 20 | Ht 66.5 in | Wt 279.8 lb

## 2019-11-24 DIAGNOSIS — Z79899 Other long term (current) drug therapy: Secondary | ICD-10-CM | POA: Diagnosis not present

## 2019-11-24 DIAGNOSIS — I10 Essential (primary) hypertension: Secondary | ICD-10-CM | POA: Insufficient documentation

## 2019-11-24 DIAGNOSIS — F329 Major depressive disorder, single episode, unspecified: Secondary | ICD-10-CM | POA: Insufficient documentation

## 2019-11-24 DIAGNOSIS — J45909 Unspecified asthma, uncomplicated: Secondary | ICD-10-CM | POA: Insufficient documentation

## 2019-11-24 DIAGNOSIS — Z7951 Long term (current) use of inhaled steroids: Secondary | ICD-10-CM | POA: Diagnosis not present

## 2019-11-24 DIAGNOSIS — C541 Malignant neoplasm of endometrium: Secondary | ICD-10-CM | POA: Diagnosis not present

## 2019-11-24 DIAGNOSIS — B009 Herpesviral infection, unspecified: Secondary | ICD-10-CM | POA: Diagnosis not present

## 2019-11-24 DIAGNOSIS — Z791 Long term (current) use of non-steroidal anti-inflammatories (NSAID): Secondary | ICD-10-CM | POA: Diagnosis not present

## 2019-11-24 DIAGNOSIS — Z90722 Acquired absence of ovaries, bilateral: Secondary | ICD-10-CM | POA: Diagnosis not present

## 2019-11-24 DIAGNOSIS — Z6841 Body Mass Index (BMI) 40.0 and over, adult: Secondary | ICD-10-CM | POA: Diagnosis not present

## 2019-11-24 DIAGNOSIS — G629 Polyneuropathy, unspecified: Secondary | ICD-10-CM | POA: Insufficient documentation

## 2019-11-24 DIAGNOSIS — Z90710 Acquired absence of both cervix and uterus: Secondary | ICD-10-CM | POA: Insufficient documentation

## 2019-11-24 NOTE — Patient Instructions (Signed)
Please notify Dr Denman George at phone number [phone] if you notice vaginal bleeding, new pelvic or abdominal pains, bloating, feeling full easy, or a change in bladder or bowel function.   Please contact Dr Serita Grit office (at [phone]) in January, 2022 to request an appointment with her for June, 2022. Please follow-up with Dr Landry Mellow in December. Please ask Dr Landry Mellow regarding guidelines for screening tests for calcium.

## 2019-11-24 NOTE — Progress Notes (Addendum)
Gynecologic Oncology Follow-up Note  Chief Complaint:  Chief Complaint  Patient presents with  . Endometrial ca [facility])    Follow Up    Assessment/Plan:  Ms. Netha Dafoe  is a 49 y.o.  year old with stage IA grade 1 endometrioid endometrial cancer (MMR normal).  Pathology revealed low risk factors for recurrence, therefore no adjuvant therapy was recommended according to NCCN guidelines.  I discussed risk for recurrence and typical symptoms encouraged her to notify us of these should they develop between visits.  I recommend she have follow-up every 6 months for 5 years in accordance with NCCN guidelines. Those visits should include symptom assessment, physical exam and pelvic examination. Pap smears are not indicated or recommended in the routine surveillance of endometrial cancer.  We discussed the importance of consumption of Calcium and Vit D in postmenopausal years.  HPI: Ms Detria Cummings is a 49 year old P2 who is seen in consultation at the request of Dr Landry Mellow for FIGO grade 1 endometrioid endometrial adenocarcinoma in the setting of morbid obesity.   The patient reported irregular menstrual periods for approximately 3 years.  These would include frequently spaced menses as well as periods of oligomenorrhea including 18-week spans of no menstrual bleeding.  She mentioned this to her primary care physician who ordered a transvaginal ultrasound on January 11, 2019.  This revealed a uterus measuring 11.8 x 8 x 4.1 cm with an endometrial thickness of 15 mm.  The right ovary was not visualized due to body habitus and the left ovary measured 2.2 x 1.3 x 1.7 cm with hypoechoic area seen which may represent an ovarian cyst.  Due to the upper limit of normal endometrium and her abnormal uterine bleeding symptoms she was referred to Dr. Landry Mellow.  Dr. Landry Mellow saw the patient on March 18, 2019 and performed an endometrial biopsy at that visit which revealed FIGO grade 1 endometrial adenocarcinoma with  extensive complex atypical hyperplasia.  .  The patient otherwise has a medical history significant for morbid obesity.  Her BMI is 45 kg meters squared.  She has hypertension and asthma.  She denies a history of diabetes mellitus.  She takes amitriptyline for a left foot injury with associated numbness on the sole of her left foot.  She has a remote history of pseudotumor cerebri, however has no headaches associated with this.  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  Her prior abdominal surgeries include 2 prior cesarean sections 1 which included a tubal ligation.  Additionally she had the tumor from the lateral side of her head.  She has had no vaginal births.  She had no history of abnormal Pap smears and her last normal Pap smear was in January 2019 was normal.  She has no family history of malignancy.  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  She lives with her husband and her adult daughter.  Interval Hx:  On 04/28/19 she underwent a robotic assisted total hysterectomy with BSO and sentinel lymph node biopsy (unilateral to the right).  Surgery was complicated by morbid obesity making retroperitoneal visualization limited.  Final pathology was resulted as a FIGO stage Ia grade 1 endometrioid endometrial adenocarcinoma.  There were 2 of 20 mm depth of invasion (inner half myometrial invasion) with no LVSI identified.  The adnexa, cervix, and sentinel lymph nodes were all negative for metastatic disease.  MMR  normal, MSI stable.  She was determined to have low risk disease for recurrence and was recommended close follow-up but no adjuvant therapy in accordance with NCCN guidelines.  She is done well since surgery with no specific complaints. She requested information about the risk of bone thinning with her post-menopausal state.   Current Meds:  Outpatient Encounter Medications as of  11/24/2019  Medication Sig  . albuterol (PROVENTIL HFA;VENTOLIN HFA) 108 (90 Base) MCG/ACT inhaler Inhale 1-2 puffs into the lungs every 6 (six) hours as needed for wheezing or shortness of breath.  Marland Kitchen amitriptyline (ELAVIL) 25 MG tablet Take 75 mg by mouth at bedtime. For shooting leg pain.  . chlorthalidone (HYGROTON) 25 MG tablet Take 25 mg by mouth daily.  . DULERA 100-5 MCG/ACT AERO Inhale 2 puffs into the lungs 2 (two) times daily.   . ergocalciferol (VITAMIN D2) 1.25 MG (50000 UT) capsule Take 50,000 Units by mouth every Friday.   . lidocaine (LIDODERM) 5 % Place 1 patch onto the skin daily as needed for pain.  Marland Kitchen LINZESS 72 MCG capsule Take 72 mcg by mouth daily.  . montelukast (SINGULAIR) 10 MG tablet Take 10 mg by mouth every evening.   . naproxen (NAPROSYN) 500 MG tablet Take 500 mg by mouth 2 (two) times daily.  . Probiotic Product (PROBIOTIC PO) Take 1 capsule by mouth daily.  . verapamil (CALAN-SR) 240 MG CR tablet Take 240 mg by mouth at bedtime.  . potassium chloride SA (KLOR-CON) 20 MEQ tablet Take 1 tablet (20 mEq total) by mouth 2 (two) times daily for 3 doses.  . [DISCONTINUED] acetaminophen (TYLENOL) 650 MG CR tablet Take 650 mg by mouth daily.  . [DISCONTINUED] bisacodyl (BISACODYL) 5 MG EC tablet Take 5 mg by mouth daily as needed for moderate constipation.  . [DISCONTINUED] ciprofloxacin (CIPRO) 250 MG tablet Take 1 tablet (250 mg total) by mouth 2 (two) times daily. (Patient not taking: Reported on 11/24/2019)  . [DISCONTINUED] Ferrous Sulfate (SLOW FE PO) Take 1 tablet by mouth daily.   . [DISCONTINUED] Ferrous Sulfate (SLOW FE) 142 (45 Fe) MG TBCR Take 45 mg by mouth daily.  . [DISCONTINUED] megestrol (MEGACE) 40 MG tablet Take 40 mg by mouth every evening.   . [DISCONTINUED] nitrofurantoin, macrocrystal-monohydrate, (MACROBID) 100 MG capsule Take 1 capsule (100 mg total) by mouth 2 (two) times daily. (Patient not taking: Reported on 11/24/2019)   No facility-administered  encounter medications on file as of 11/24/2019.    Allergy:  Allergies  Allergen Reactions  . Prednisone Other (See Comments)    "feels like a raving lunatic...angry"  . Latex Rash    Hot to the touch.     Social Hx:   Social History   Socioeconomic History  . Marital status: Married    Spouse name: Not on file  . Number of children: Not on file  . Years of education: Not on file  . Highest education level: Not on file  Occupational History  . Not on file  Tobacco Use  . Smoking status: Never Smoker  . Smokeless tobacco: Never Used  Substance and Sexual Activity  . Alcohol use: No  . Drug use: Never  . Sexual activity: Yes  Other Topics Concern  . Not on file  Social History Narrative  . Not on file   Social Determinants of Health   Financial Resource Strain:   . Difficulty of Paying Living Expenses:   Food Insecurity:   . Worried About Charity fundraiser  in the Last Year:   . [place] in the Last Year:   Transportation Needs:   . Film/video editor (Medical):   Marland Kitchen Lack of Transportation (Non-Medical):   Physical Activity:   . Days of Exercise per Week:   . Minutes of Exercise per Session:   Stress:   . Feeling of Stress :   Social Connections:   . Frequency of Communication with Friends and Family:   . Frequency of Social Gatherings with Friends and Family:   . Attends Religious Services:   . Active Member of Clubs or Organizations:   . Attends Archivist Meetings:   Marland Kitchen Marital Status:   Intimate Partner Violence:   . Fear of Current or Ex-Partner:   . Emotionally Abused:   Marland Kitchen Physically Abused:   . Sexually Abused:     Past Surgical Hx:  Past Surgical History:  Procedure Laterality Date  . CESAREAN SECTION    . genital herpes N/A   . LAPAROSCOPIC TUBAL LIGATION Bilateral   . OTHER SURGICAL HISTORY Right    Benign tumor removed from right side of head at age 77  . ROBOTIC ASSISTED TOTAL HYSTERECTOMY WITH BILATERAL SALPINGO  OOPHERECTOMY N/A 04/28/2019   Procedure: XI ROBOTIC ASSISTED TOTAL HYSTERECTOMY WITH BILATERAL SALPINGO OOPHORECTOMY;  Surgeon: Everitt Amber, MD;  Location: [facility];  Service: Gynecology;  Laterality: N/A;  . SENTINEL NODE BIOPSY N/A 04/28/2019   Procedure: SENTINEL NODE BIOPSY;  Surgeon: Everitt Amber, MD;  Location: [facility];  Service: Gynecology;  Laterality: N/A;    Past Medical Hx:  Past Medical History:  Diagnosis Date  . Allergic rhinitis   . Asthma   . Complication of anesthesia    Unable to feel extremities for a long time after surgery --25 years ago  . Depression   . Genital herpes   . Hypertension   . Insomnia   . Migraine   . Neuropathy   . PONV (postoperative nausea and vomiting)   . Pseudotumor cerebri     Past Gynecological History:  C/s x2  No LMP recorded. (Menstrual status: Perimenopausal).  Family Hx:  Family History  Problem Relation Age of Onset  . High Cholesterol Mother   . Prostate cancer Father   . High Cholesterol Father   . Arthritis Father   . Coronary artery disease Maternal Grandfather   . Stroke Paternal Grandmother   . Macular degeneration Maternal Grandmother   . Ovarian cancer Neg Hx   . Endometrial cancer Neg Hx     Review of Systems:  Constitutional  Feels well,    ENT Normal appearing ears and nares bilaterally Skin/Breast  No rash, sores, jaundice, itching, dryness Cardiovascular  No chest pain, shortness of breath, or edema  Pulmonary  No cough or wheeze.  Gastro Intestinal  No nausea, vomitting, or diarrhoea. No bright red blood per rectum, no abdominal pain, change in bowel movement, or constipation.  Genito Urinary  No frequency, urgency, dysuria, + abnormal uterine bleeding Musculo Skeletal  No myalgia, arthralgia, joint swelling or pain  Neurologic  No weakness, numbness, change in gait,  Psychology  No depression, anxiety, insomnia.   Vitals:  Blood pressure 120/81, pulse 85,  temperature (!) 97.1 F (36.2 C), temperature source Tympanic, resp. rate 20, height 5' 6.5" (1.689 m), weight 279 lb 12.8 oz (126.9 kg), SpO2 100 %.  Physical Exam: WD in NAD Neck  Supple NROM, without any enlargements.  Lymph Node Survey No  cervical supraclavicular or inguinal adenopathy Cardiovascular  Pulse normal rate, regularity and rhythm. S1 and S2 normal.  Lungs  Clear to auscultation bilateraly, without wheezes/crackles/rhonchi. Good air movement.  Skin  No rash/lesions/breakdown  Psychiatry  Alert and oriented to person, place, and time  Abdomen  Normoactive bowel sounds, abdomen soft, non-tender and obese without evidence of hernia. Incisions healing well. Right lateral incision slightly opened at the epithelium with some erythema to surrounding skin but no purulence or drainage or obvious cellulitis.  Back No CVA tenderness Genito Urinary  Vaginal cuff: in tact, no blood Bimanual: no masses Rectal  Good tone, no masses no cul de sac nodularity.  Extremities  No bilateral cyanosis, clubbing or edema.   Thereasa Solo, MD  11/24/2019, 4:09 PM

## 2019-12-28 DIAGNOSIS — Z131 Encounter for screening for diabetes mellitus: Secondary | ICD-10-CM | POA: Diagnosis not present

## 2019-12-28 DIAGNOSIS — Z13220 Encounter for screening for lipoid disorders: Secondary | ICD-10-CM | POA: Diagnosis not present

## 2019-12-28 DIAGNOSIS — Z0000 Encounter for general adult medical examination without abnormal findings: Secondary | ICD-10-CM | POA: Diagnosis not present

## 2020-01-06 DIAGNOSIS — G603 Idiopathic progressive neuropathy: Secondary | ICD-10-CM | POA: Diagnosis not present

## 2020-01-06 DIAGNOSIS — G43009 Migraine without aura, not intractable, without status migrainosus: Secondary | ICD-10-CM | POA: Diagnosis not present

## 2020-01-06 DIAGNOSIS — M545 Low back pain: Secondary | ICD-10-CM | POA: Diagnosis not present

## 2020-01-06 DIAGNOSIS — E559 Vitamin D deficiency, unspecified: Secondary | ICD-10-CM | POA: Diagnosis not present

## 2020-01-06 DIAGNOSIS — Z79899 Other long term (current) drug therapy: Secondary | ICD-10-CM | POA: Diagnosis not present

## 2020-01-06 DIAGNOSIS — K5909 Other constipation: Secondary | ICD-10-CM | POA: Diagnosis not present

## 2020-02-17 DIAGNOSIS — H47323 Drusen of optic disc, bilateral: Secondary | ICD-10-CM | POA: Diagnosis not present

## 2020-02-17 DIAGNOSIS — H524 Presbyopia: Secondary | ICD-10-CM | POA: Diagnosis not present

## 2020-04-10 ENCOUNTER — Other Ambulatory Visit: Payer: Self-pay | Admitting: Family Medicine

## 2020-04-10 DIAGNOSIS — Z1231 Encounter for screening mammogram for malignant neoplasm of breast: Secondary | ICD-10-CM

## 2020-04-12 ENCOUNTER — Ambulatory Visit (INDEPENDENT_AMBULATORY_CARE_PROVIDER_SITE_OTHER): Payer: Federal, State, Local not specified - PPO

## 2020-04-12 ENCOUNTER — Other Ambulatory Visit: Payer: Self-pay

## 2020-04-12 DIAGNOSIS — Z1231 Encounter for screening mammogram for malignant neoplasm of breast: Secondary | ICD-10-CM | POA: Diagnosis not present

## 2020-04-16 ENCOUNTER — Other Ambulatory Visit: Payer: Self-pay | Admitting: Family Medicine

## 2020-04-16 DIAGNOSIS — R928 Other abnormal and inconclusive findings on diagnostic imaging of breast: Secondary | ICD-10-CM

## 2020-05-07 ENCOUNTER — Ambulatory Visit
Admission: RE | Admit: 2020-05-07 | Discharge: 2020-05-07 | Disposition: A | Discharge status: Home / Self Care | Payer: Federal, State, Local not specified - PPO | Source: Ambulatory Visit | Attending: Family Medicine | Admitting: Family Medicine

## 2020-05-07 ENCOUNTER — Other Ambulatory Visit: Payer: Self-pay

## 2020-05-07 ENCOUNTER — Ambulatory Visit: Payer: Federal, State, Local not specified - PPO

## 2020-05-07 DIAGNOSIS — R928 Other abnormal and inconclusive findings on diagnostic imaging of breast: Secondary | ICD-10-CM | POA: Diagnosis not present

## 2020-05-21 DIAGNOSIS — L0291 Cutaneous abscess, unspecified: Secondary | ICD-10-CM | POA: Diagnosis not present

## 2020-06-25 DIAGNOSIS — Z01419 Encounter for gynecological examination (general) (routine) without abnormal findings: Secondary | ICD-10-CM | POA: Diagnosis not present

## 2020-10-04 DIAGNOSIS — Z79899 Other long term (current) drug therapy: Secondary | ICD-10-CM | POA: Diagnosis not present

## 2020-10-04 DIAGNOSIS — G603 Idiopathic progressive neuropathy: Secondary | ICD-10-CM | POA: Diagnosis not present

## 2020-10-04 DIAGNOSIS — K5909 Other constipation: Secondary | ICD-10-CM | POA: Diagnosis not present

## 2020-10-04 DIAGNOSIS — E559 Vitamin D deficiency, unspecified: Secondary | ICD-10-CM | POA: Diagnosis not present

## 2020-10-04 DIAGNOSIS — R5383 Other fatigue: Secondary | ICD-10-CM | POA: Diagnosis not present

## 2020-10-04 DIAGNOSIS — R202 Paresthesia of skin: Secondary | ICD-10-CM | POA: Diagnosis not present

## 2020-10-04 DIAGNOSIS — G43009 Migraine without aura, not intractable, without status migrainosus: Secondary | ICD-10-CM | POA: Diagnosis not present

## 2020-10-04 DIAGNOSIS — M545 Low back pain, unspecified: Secondary | ICD-10-CM | POA: Diagnosis not present

## 2020-11-21 ENCOUNTER — Telehealth: Payer: Self-pay | Admitting: *Deleted

## 2020-11-21 NOTE — Telephone Encounter (Signed)
Patient called and scheduled a follow up appt for Dr Denman George

## 2020-11-23 ENCOUNTER — Encounter: Payer: Self-pay | Admitting: Gynecologic Oncology

## 2020-11-26 ENCOUNTER — Inpatient Hospital Stay: Payer: Federal, State, Local not specified - PPO | Attending: Gynecologic Oncology | Admitting: Gynecologic Oncology

## 2020-11-26 ENCOUNTER — Other Ambulatory Visit: Payer: Self-pay

## 2020-11-26 ENCOUNTER — Encounter: Payer: Self-pay | Admitting: Gynecologic Oncology

## 2020-11-26 VITALS — BP 137/96 | HR 96 | Temp 97.7°F | Resp 16 | Ht 66.0 in | Wt 279.9 lb

## 2020-11-26 DIAGNOSIS — Z79899 Other long term (current) drug therapy: Secondary | ICD-10-CM | POA: Diagnosis not present

## 2020-11-26 DIAGNOSIS — Z7951 Long term (current) use of inhaled steroids: Secondary | ICD-10-CM | POA: Insufficient documentation

## 2020-11-26 DIAGNOSIS — Z791 Long term (current) use of non-steroidal anti-inflammatories (NSAID): Secondary | ICD-10-CM | POA: Diagnosis not present

## 2020-11-26 DIAGNOSIS — R200 Anesthesia of skin: Secondary | ICD-10-CM | POA: Diagnosis not present

## 2020-11-26 DIAGNOSIS — Z6841 Body Mass Index (BMI) 40.0 and over, adult: Secondary | ICD-10-CM | POA: Diagnosis not present

## 2020-11-26 DIAGNOSIS — Z90722 Acquired absence of ovaries, bilateral: Secondary | ICD-10-CM | POA: Diagnosis not present

## 2020-11-26 DIAGNOSIS — Z90710 Acquired absence of both cervix and uterus: Secondary | ICD-10-CM | POA: Insufficient documentation

## 2020-11-26 DIAGNOSIS — C541 Malignant neoplasm of endometrium: Secondary | ICD-10-CM | POA: Diagnosis not present

## 2020-11-26 DIAGNOSIS — J45909 Unspecified asthma, uncomplicated: Secondary | ICD-10-CM | POA: Insufficient documentation

## 2020-11-26 DIAGNOSIS — I10 Essential (primary) hypertension: Secondary | ICD-10-CM | POA: Insufficient documentation

## 2020-11-26 NOTE — Patient Instructions (Signed)
Please notify Dr Denman George at phone number [phone] if you notice vaginal bleeding, new pelvic or abdominal pains, bloating, feeling full easy, or a change in bladder or bowel function.   Please return to see Dr Landry Mellow in 6 months.  Please contact Dr Serita Grit office (at [phone]) in 6 months after your appointment with Dr Landry Mellow to request an appointment with her for June, 2023.

## 2020-11-26 NOTE — Progress Notes (Signed)
Gynecologic Oncology Follow-up Note  Chief Complaint:  Chief Complaint  Patient presents with  . Endometrial ca [facility])    Assessment/Plan:  [name]  is a 49 y.o.  year old with a history of stage IA grade 1 endometrioid endometrial cancer (MMR normal).  Pathology revealed low risk factors for recurrence, therefore no adjuvant therapy was recommended according to NCCN guidelines.  I discussed risk for recurrence and typical symptoms encouraged her to notify us of these should they develop between visits.  I recommend she have follow-up every 6 months for 5 years in accordance with NCCN guidelines. Those visits should include symptom assessment, physical exam and pelvic examination. Pap smears are not indicated or recommended in the routine surveillance of endometrial cancer.  HPI: [name] is a 50 year old P2 who is seen in consultation at the request of Dr Gabriela Alexander for FIGO grade 1 endometrioid endometrial adenocarcinoma in the setting of morbid obesity.   The patient reported irregular menstrual periods for approximately 3 years.  These would include frequently spaced menses as well as periods of oligomenorrhea including 18-week spans of no menstrual bleeding.  She mentioned this to her primary care physician who ordered a transvaginal ultrasound on January 11, 2019.  This revealed a uterus measuring 11.8 x 8 x 4.1 cm with an endometrial thickness of 15 mm.  The right ovary was not visualized due to body habitus and the left ovary measured 2.2 x 1.3 x 1.7 cm with hypoechoic area seen which may represent an ovarian cyst.  Due to the upper limit of normal endometrium and her abnormal uterine bleeding symptoms she was referred to Dr. Landry Alexander.  Dr. Landry Alexander saw the patient on March 18, 2019 and performed an endometrial biopsy at that visit which revealed FIGO grade 1 endometrial adenocarcinoma with extensive complex atypical hyperplasia.  The patient otherwise has a medical history significant  for morbid obesity.  Her BMI is 45 kg meters squared.  She has hypertension and asthma.  She denies a history of diabetes mellitus.  She takes amitriptyline for a left foot injury with associated numbness on the sole of her left foot.  She has a remote history of pseudotumor cerebri, however has no headaches associated with this.  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  On 04/28/19 she underwent a robotic assisted total hysterectomy with BSO and sentinel lymph node biopsy (unilateral to the right).  Surgery was complicated by morbid obesity making retroperitoneal visualization limited.  Final pathology was resulted as a FIGO stage Ia grade 1 endometrioid endometrial adenocarcinoma.  There were 2 of 20 mm depth of invasion (inner half myometrial invasion) with no LVSI identified.  The adnexa, cervix, and sentinel lymph nodes were all negative for metastatic disease.  MMR normal, MSI stable.  She was determined to have low risk disease for recurrence and was recommended close follow-up but no adjuvant therapy in accordance with NCCN guidelines.  Interval Hx:  She has no symptoms concerning for recurrence.    Current Meds:  Outpatient Encounter Medications as of 11/26/2020  Medication Sig  . albuterol (PROVENTIL HFA;VENTOLIN HFA) 108 (90 Base) MCG/ACT inhaler Inhale 1-2 puffs into the lungs every 6 (six) hours as needed for wheezing or shortness of breath.  Marland Kitchen amitriptyline (ELAVIL) 25 MG tablet Take 75 mg by mouth at bedtime. For shooting leg pain.  . chlorthalidone (HYGROTON) 25 MG tablet Take 25 mg by  mouth daily.  . DULERA 100-5 MCG/ACT AERO Inhale 2 puffs into the lungs 2 (two) times daily.   . ergocalciferol (VITAMIN D2) 1.25 MG (50000 UT) capsule Take 50,000 Units by mouth every Friday.   Marland Kitchen LINZESS 145 MCG CAPS capsule Take 145 mcg by mouth every morning.  . montelukast (SINGULAIR) 10 MG tablet Take 10 mg by mouth every evening.   .  naproxen (NAPROSYN) 500 MG tablet Take 500 mg by mouth daily.  . Probiotic Product (PROBIOTIC PO) Take 1 capsule by mouth daily.  . verapamil (CALAN-SR) 240 MG CR tablet Take 240 mg by mouth at bedtime.  . lidocaine (LIDODERM) 5 % Place 1 patch onto the skin daily as needed for pain. (Patient not taking: No sig reported)  . potassium chloride SA (KLOR-CON) 20 MEQ tablet Take 1 tablet (20 mEq total) by mouth 2 (two) times daily for 3 doses.  . [DISCONTINUED] LINZESS 72 MCG capsule Take 72 mcg by mouth daily. (Patient not taking: Reported on 11/23/2020)   No facility-administered encounter medications on file as of 11/26/2020.    Allergy:  Allergies  Allergen Reactions  . Prednisone Other (See Comments)    "feels like a raving lunatic...angry"  . Latex Rash    Hot to the touch.     Social Hx:   Social History   Socioeconomic History  . Marital status: Married    Spouse name: Not on file  . Number of children: Not on file  . Years of education: Not on file  . Highest education level: Not on file  Occupational History  . Not on file  Tobacco Use  . Smoking status: Never Smoker  . Smokeless tobacco: Never Used  Vaping Use  . Vaping Use: Never used  Substance and Sexual Activity  . Alcohol use: No  . Drug use: Never  . Sexual activity: Yes  Other Topics Concern  . Not on file  Social History Narrative  . Not on file   Social Determinants of Health   Financial Resource Strain: Not on file  Food Insecurity: Not on file  Transportation Needs: Not on file  Physical Activity: Not on file  Stress: Not on file  Social Connections: Not on file  Intimate Partner Violence: Not on file    Past Surgical Hx:  Past Surgical History:  Procedure Laterality Date  . ABDOMINAL HYSTERECTOMY    . CESAREAN SECTION    . genital herpes N/A   . LAPAROSCOPIC TUBAL LIGATION Bilateral   . OOPHORECTOMY    . OTHER SURGICAL HISTORY Right    Benign tumor removed from right side of head at age 60   . ROBOTIC ASSISTED TOTAL HYSTERECTOMY WITH BILATERAL SALPINGO OOPHERECTOMY N/A 04/28/2019   Procedure: XI ROBOTIC ASSISTED TOTAL HYSTERECTOMY WITH BILATERAL SALPINGO OOPHORECTOMY;  Surgeon: Everitt Amber, MD;  Location: [place];  Service: Gynecology;  Laterality: N/A;  . SENTINEL NODE BIOPSY N/A 04/28/2019   Procedure: SENTINEL NODE BIOPSY;  Surgeon: Everitt Amber, MD;  Location: [facility];  Service: Gynecology;  Laterality: N/A;    Past Medical Hx:  Past Medical History:  Diagnosis Date  . Allergic rhinitis   . Asthma   . Complication of anesthesia    Unable to feel extremities for a long time after surgery --25 years ago  . Depression   . Genital herpes   . Hypertension   . Insomnia   . Migraine   . Neuropathy   . PONV (postoperative nausea and vomiting)   .  Pseudotumor cerebri     Past Gynecological History:  C/s x2  No LMP recorded. (Menstrual status: Perimenopausal).  Family Hx:  Family History  Problem Relation Age of Onset  . High Cholesterol Mother   . Prostate cancer Father   . High Cholesterol Father   . Arthritis Father   . Coronary artery disease Maternal Grandfather   . Stroke Paternal Grandmother   . Macular degeneration Maternal Grandmother   . Ovarian cancer Neg Hx   . Endometrial cancer Neg Hx     Review of Systems:  Constitutional  Feels well,    ENT Normal appearing ears and nares bilaterally Skin/Breast  No rash, sores, jaundice, itching, dryness Cardiovascular  No chest pain, shortness of breath, or edema  Pulmonary  No cough or wheeze.  Gastro Intestinal  No nausea, vomitting, or diarrhoea. No bright red blood per rectum, no abdominal pain, change in bowel movement, or constipation.  Genito Urinary  No frequency, urgency, dysuria, no bleeding Musculo Skeletal  No myalgia, arthralgia, joint swelling or pain  Neurologic  No weakness, numbness, change in gait,  Psychology  No depression, anxiety, insomnia.    Vitals:  Blood pressure (!) 137/96, pulse 96, temperature 97.7 F (36.5 C), temperature source Tympanic, resp. rate 16, height _0  (1.676 m), weight 279 lb 14.4 oz (127 kg), SpO2 100 %.  Physical Exam: WD in NAD Neck  Supple NROM, without any enlargements.  Lymph Node Survey No cervical supraclavicular or inguinal adenopathy Cardiovascular  Well perfused peripheries  Lungs  No increased WOB Skin  No rash/lesions/breakdown  Psychiatry  Alert and oriented to person, place, and time  Abdomen  Normoactive bowel sounds, abdomen soft, non-tender and obese without evidence of hernia. Incisions soft.  Back No CVA tenderness Genito Urinary  Vaginal cuff: in tact, no blood Bimanual: no masses Rectal  Good tone, no masses no cul de sac nodularity.  Extremities  No bilateral cyanosis, clubbing or edema.   Thereasa Solo, MD  11/26/2020, 3:41 PM

## 2021-01-08 DIAGNOSIS — Z13220 Encounter for screening for lipoid disorders: Secondary | ICD-10-CM | POA: Diagnosis not present

## 2021-01-08 DIAGNOSIS — Z0000 Encounter for general adult medical examination without abnormal findings: Secondary | ICD-10-CM | POA: Diagnosis not present

## 2021-01-08 DIAGNOSIS — Z0189 Encounter for other specified special examinations: Secondary | ICD-10-CM | POA: Diagnosis not present

## 2021-01-11 ENCOUNTER — Other Ambulatory Visit: Payer: Self-pay | Admitting: Nurse Practitioner

## 2021-01-11 DIAGNOSIS — E8940 Asymptomatic postprocedural ovarian failure: Secondary | ICD-10-CM

## 2021-01-14 DIAGNOSIS — M79672 Pain in left foot: Secondary | ICD-10-CM | POA: Diagnosis not present

## 2021-01-14 DIAGNOSIS — M13872 Other specified arthritis, left ankle and foot: Secondary | ICD-10-CM | POA: Diagnosis not present

## 2021-01-14 DIAGNOSIS — M25572 Pain in left ankle and joints of left foot: Secondary | ICD-10-CM | POA: Diagnosis not present

## 2021-01-15 ENCOUNTER — Other Ambulatory Visit: Payer: Self-pay | Admitting: Orthopedic Surgery

## 2021-01-15 DIAGNOSIS — M13872 Other specified arthritis, left ankle and foot: Secondary | ICD-10-CM

## 2021-01-31 ENCOUNTER — Ambulatory Visit
Admission: RE | Admit: 2021-01-31 | Discharge: 2021-01-31 | Disposition: A | Discharge status: Home / Self Care | Payer: Federal, State, Local not specified - PPO | Source: Ambulatory Visit | Attending: Orthopedic Surgery | Admitting: Orthopedic Surgery

## 2021-01-31 DIAGNOSIS — M13872 Other specified arthritis, left ankle and foot: Secondary | ICD-10-CM

## 2021-01-31 DIAGNOSIS — M19072 Primary osteoarthritis, left ankle and foot: Secondary | ICD-10-CM | POA: Diagnosis not present

## 2021-01-31 DIAGNOSIS — M7732 Calcaneal spur, left foot: Secondary | ICD-10-CM | POA: Diagnosis not present

## 2021-02-01 ENCOUNTER — Encounter (HOSPITAL_BASED_OUTPATIENT_CLINIC_OR_DEPARTMENT_OTHER): Payer: Self-pay

## 2021-02-01 ENCOUNTER — Other Ambulatory Visit: Payer: Self-pay

## 2021-02-01 ENCOUNTER — Emergency Department (HOSPITAL_BASED_OUTPATIENT_CLINIC_OR_DEPARTMENT_OTHER)
Admission: EM | Admit: 2021-02-01 | Discharge: 2021-02-02 | Disposition: A | Discharge status: Home / Self Care | Payer: Federal, State, Local not specified - PPO | Attending: Emergency Medicine | Admitting: Emergency Medicine

## 2021-02-01 DIAGNOSIS — S6992XA Unspecified injury of left wrist, hand and finger(s), initial encounter: Secondary | ICD-10-CM | POA: Diagnosis not present

## 2021-02-01 DIAGNOSIS — S61012A Laceration without foreign body of left thumb without damage to nail, initial encounter: Secondary | ICD-10-CM | POA: Diagnosis not present

## 2021-02-01 DIAGNOSIS — J45909 Unspecified asthma, uncomplicated: Secondary | ICD-10-CM | POA: Diagnosis not present

## 2021-02-01 DIAGNOSIS — Z79899 Other long term (current) drug therapy: Secondary | ICD-10-CM | POA: Insufficient documentation

## 2021-02-01 DIAGNOSIS — W260XXA Contact with knife, initial encounter: Secondary | ICD-10-CM | POA: Insufficient documentation

## 2021-02-01 DIAGNOSIS — Z7951 Long term (current) use of inhaled steroids: Secondary | ICD-10-CM | POA: Diagnosis not present

## 2021-02-01 DIAGNOSIS — Y93G3 Activity, cooking and baking: Secondary | ICD-10-CM | POA: Diagnosis not present

## 2021-02-01 DIAGNOSIS — Z23 Encounter for immunization: Secondary | ICD-10-CM | POA: Insufficient documentation

## 2021-02-01 DIAGNOSIS — I10 Essential (primary) hypertension: Secondary | ICD-10-CM | POA: Insufficient documentation

## 2021-02-01 DIAGNOSIS — Z91040 Latex allergy status: Secondary | ICD-10-CM | POA: Diagnosis not present

## 2021-02-01 MED ORDER — ACETAMINOPHEN 500 MG PO TABS
1000.0000 mg | ORAL_TABLET | Freq: Once | ORAL | Status: AC
  Administered 2021-02-01: 1000 mg via ORAL
  Filled 2021-02-01: qty 2

## 2021-02-01 MED ORDER — TETANUS-DIPHTH-ACELL PERTUSSIS 5-2.5-18.5 LF-MCG/0.5 IM SUSY
0.5000 mL | PREFILLED_SYRINGE | Freq: Once | INTRAMUSCULAR | Status: AC
  Administered 2021-02-01: 0.5 mL via INTRAMUSCULAR
  Filled 2021-02-01: qty 0.5

## 2021-02-01 MED ORDER — LIDOCAINE HCL (PF) 1 % IJ SOLN
10.0000 mL | Freq: Once | INTRAMUSCULAR | Status: AC
  Administered 2021-02-01: 10 mL
  Filled 2021-02-01 (×2): qty 10

## 2021-02-01 NOTE — ED Notes (Signed)
ED Provider at bedside. 

## 2021-02-01 NOTE — ED Provider Notes (Signed)
[facility]ERGENCY DEPARTMENT Provider Note   CSN: [id number] Arrival date & time: 02/01/21  1654     History Chief Complaint  Patient presents with   Extremity Laceration    Gabriela Alexander is a 50 y.o. female with medical history as noted below.  Patient presents to the emergency department with a chief complaint of laceration.  States that laceration approximately at 1630.  Patient states that she accidentally cut herself with a knife while cooking okra.  Patient reports pain to laceration site.  Patient describes pain as "throbbing."  Pain is rated 6/10 on pain scale.  Patient reports that pain is worse with touch.  Patient has not tried any factors to alleviate her pain.  Denies any numbness, weakness.  Patient is unsure when her last tetanus shot was.  Patient is right-hand dominant.  HPI     Past Medical History:  Diagnosis Date   Allergic rhinitis    Asthma    Complication of anesthesia    Unable to feel extremities for a long time after surgery --25 years ago   Depression    Genital herpes    Hypertension    Insomnia    Migraine    Neuropathy    PONV (postoperative nausea and vomiting)    Pseudotumor cerebri     Patient Active Problem List   Diagnosis Date Noted   Endometrial ca ([place]) 04/11/2019   Morbid obesity with BMI of 45.0-49.9, adult ([place]) 04/11/2019    Past Surgical History:  Procedure Laterality Date   ABDOMINAL HYSTERECTOMY     CESAREAN SECTION     genital herpes N/A    LAPAROSCOPIC TUBAL LIGATION Bilateral    OOPHORECTOMY     OTHER SURGICAL HISTORY Right    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     Family History  Problem Relation Age of Onset   High Cholesterol Mother    Prostate cancer Father    High Cholesterol Father    Arthritis Father    Coronary artery disease Maternal Grandfather    Stroke Paternal Grandmother    Macular degeneration Maternal Grandmother    Ovarian cancer Neg Hx    Endometrial cancer Neg Hx     Social History   Tobacco Use   Smoking status: Never   Smokeless tobacco: Never  Vaping Use   Vaping Use: Never used  Substance Use Topics   Alcohol use: No   Drug use: Never    Home Medications Prior to Admission medications   Medication Sig Start Date End Date Taking? Authorizing Provider  albuterol (PROVENTIL HFA;VENTOLIN HFA) 108 (90 Base) MCG/ACT inhaler Inhale 1-2 puffs into the lungs every 6 (six) hours as needed for wheezing or shortness of breath.    [provider]  amitriptyline (ELAVIL) 25 MG tablet Take 75 mg by mouth at bedtime. For shooting leg pain.    [provider]  chlorthalidone (HYGROTON) 25 MG tablet Take 25 mg by mouth daily.  [provider]  DULERA 100-5 MCG/ACT AERO Inhale 2 puffs into the lungs 2 (two) times daily.  11/05/15   [provider]  ergocalciferol (VITAMIN D2) 1.25 MG (50000 UT) capsule Take 50,000 Units by mouth every Friday.     [provider]  lidocaine (LIDODERM) 5 % Place 1 patch onto the skin daily as needed for pain. Patient not taking: No sig reported 12/03/18   [provider]  LINZESS 145 MCG CAPS capsule Take 145 mcg by mouth every morning. 11/09/20   [provider]  montelukast (SINGULAIR) 10 MG tablet Take 10 mg by mouth every evening.     [provider]  naproxen (NAPROSYN) 500 MG tablet Take 500 mg by mouth daily. 12/03/18   [provider]  potassium chloride SA (KLOR-CON) 20 MEQ tablet Take 1 tablet (20 mEq total) by mouth 2 (two) times daily for 3 doses. 04/25/19 04/27/19  Joylene John D, NP  Probiotic Product (PROBIOTIC PO) Take 1 capsule by mouth daily.    [provider]  verapamil (CALAN-SR) 240 MG CR tablet Take 240 mg by mouth at bedtime. 04/04/19   [provider]    Allergies    Prednisone and Latex  Review of Systems   Review of Systems  Skin:  Positive for wound. Negative for color change, pallor and rash.  Neurological:  Negative for weakness and numbness.  Hematological:  Does not bruise/bleed easily.   Physical Exam Updated Vital Signs BP (!) 130/99 (BP Location: Right Arm)   Pulse 91   Temp 98.4 F (36.9 C) (Oral)   Resp 20   Ht '5\' 6"'$  (1.676 m)   Wt 127 kg   LMP 01/24/2019 (Approximate)   SpO2 100%   BMI 45.19 kg/m   Physical Exam Cardiovascular:     Pulses:          Radial pulses are 2+ on the left side.  Musculoskeletal:     Left hand: Laceration and tenderness (to laceration site) present. No swelling, deformity or bony tenderness. Normal range of motion. Normal sensation. Normal capillary refill.     Comments: 0.5 cm semicircular laceration to distal tip of left thumb, slow hemorrhage noted.  No foreign bodies noted.  Sensation intact to all aspects of affected digit, cap refill less than 2 seconds in affected digit, full range of motion to affected digit.    ED Results / Procedures / Treatments   Labs (all labs ordered are listed, but only abnormal results are displayed) Labs Reviewed - No data to display  EKG None  Radiology No results found.  Procedures .Marland KitchenLaceration Repair  Date/Time: 02/02/2021 2:45 AM Performed by: Loni Beckwith, PA-C Authorized by: Loni Beckwith, PA-C   Consent:    Consent obtained:  Verbal   Consent given by:  Patient   Risks discussed:  Infection, need for additional repair, pain, poor cosmetic result  and poor wound healing   Alternatives discussed:  No treatment and delayed treatment Universal protocol:    Procedure explained and questions answered to patient or proxy's satisfaction: yes     Immediately prior to procedure, a time out was called: yes     Patient identity confirmed:  Verbally with patient and arm band Anesthesia:    Anesthesia method:  Local infiltration and nerve block   Local anesthetic:  Lidocaine 1% w/o epi   Block location:  Digital   Block needle gauge:  25 G   Block anesthetic:  Lidocaine 1%  w/o epi   Block injection procedure:  Anatomic landmarks identified, anatomic landmarks palpated, introduced needle, negative aspiration for blood and incremental injection   Block outcome:  Incomplete block Laceration details:    Location:  Finger   Finger location:  L thumb   Length (cm):  0.5   Depth (mm):  2 Pre-procedure details:    Preparation:  Patient was prepped and draped in usual sterile fashion Exploration:    Hemostasis achieved with:  Tourniquet and direct pressure   Wound exploration: entire depth of wound visualized     Wound extent: no foreign bodies/material noted     Contaminated: no   Treatment:    Area cleansed with:  Povidone-iodine   Amount of cleaning:  Standard   Irrigation solution:  Sterile saline and sterile water Skin repair:    Repair method:  Sutures   Suture size:  4-0   Suture material:  Nylon   Number of sutures:  3 Approximation:    Approximation:  Close Repair type:    Repair type:  Simple Post-procedure details:    Dressing:  Bulky dressing and sterile dressing   Procedure completion:  Tolerated well, no immediate complications Comments:     Nerve block was incomplete, due to this local infiltration with lidocaine without epinephrine was also performed.      Medications Ordered in ED Medications - No data to display  ED Course  I have reviewed the triage vital signs and the nursing notes.  Pertinent labs & imaging  results that were available during my care of the patient were reviewed by me and considered in my medical decision making (see chart for details).    MDM Rules/Calculators/A&P                            Alert 50 year old female no acute distress, nontoxic appearing.  Presents with chief complaint of left thumb.  Patient reports she accidentally cut herself with a knife while cooking.  Patient was unable to achieve hemostasis at home with direct pressure.  0.5 cm semicircular laceration to distal tip of left thumb, slow hemorrhage noted.  Entire depth of wound was viewed in a clear and bloodless field, no foreign bodies noted.  Sensation intact to all aspects of affected digit, cap refill less than 2 seconds in affected digit, full range of motion to affected digit.  Unable to achieve hemostasis with direct pressure, hemostatic agent, or finger tourniquet.  Patient is not on any blood thinners.  Eventually hemostasis was achieved and suture procedure was performed as noted above.  Tetanus shot updated.  Patient will follow-up with primary care provider in 7 days to have sutures removed.  Discussed results, findings, treatment and follow up. Patient advised of return precautions. Patient verbalized understanding and agreed with plan.      Final Clinical Impression(s) / ED Diagnoses Final diagnoses:  Laceration of left thumb without foreign body without damage to nail, initial encounter    Rx / DC Orders ED Discharge Orders     None        Loni Beckwith, PA-C 02/02/21 0255    Charlesetta Shanks, MD 02/24/21 [phone]

## 2021-02-01 NOTE — Discharge Instructions (Addendum)
You came to the emergency department today to be evaluated for your finger laceration.  Your physical exam was reassuring.  Your laceration required stitches.  Please follow-up with your primary care provider in 7 days to have the stitches removed.  Please keep the wound dry for the next 24 hours; after this you may gently wash the area with soap and water.  Please do not submerge the wound until sutures are removed.  Get help right away if: You develop severe swelling around the wound. Your pain suddenly increases and is severe. You develop painful lumps near the wound or on skin anywhere else on your body. You have a red streak going away from your wound. The wound is on your hand or foot and you cannot properly move a finger or toe. The wound is on your hand or foot, and you notice that your fingers or toes look pale or bluish.

## 2021-02-01 NOTE — ED Provider Notes (Signed)
I provided a substantive portion of the care of this patient.  I personally performed the entirety of the exam for this encounter.     Patient has a minor laceration to the tip of the thumb from cutting it with a knife.  Patient has a small flap on the tip of the thumb that continues to ooze red blood.  No damage to the nail.  Patient has injury limited to a small portion of the pad without any tendon involvement.  Will need several stitches for bleeding.   Charlesetta Shanks, MD 02/01/21 2218

## 2021-02-01 NOTE — ED Triage Notes (Signed)
Pt states she cut left thumb tip with kitchen knife ~430pm-lac noted to side and across tip-bleeding-gauze/kling dsg applied

## 2021-02-06 DIAGNOSIS — K080 Exfoliation of teeth due to systemic causes: Secondary | ICD-10-CM | POA: Diagnosis not present

## 2021-02-07 ENCOUNTER — Other Ambulatory Visit: Payer: Self-pay

## 2021-02-07 ENCOUNTER — Ambulatory Visit
Admission: RE | Admit: 2021-02-07 | Discharge: 2021-02-07 | Disposition: A | Discharge status: Home / Self Care | Payer: Federal, State, Local not specified - PPO | Source: Ambulatory Visit | Attending: Nurse Practitioner | Admitting: Nurse Practitioner

## 2021-02-07 DIAGNOSIS — Z780 Asymptomatic menopausal state: Secondary | ICD-10-CM | POA: Diagnosis not present

## 2021-02-07 DIAGNOSIS — E8940 Asymptomatic postprocedural ovarian failure: Secondary | ICD-10-CM

## 2021-02-11 DIAGNOSIS — Z4889 Encounter for other specified surgical aftercare: Secondary | ICD-10-CM | POA: Diagnosis not present

## 2021-02-18 DIAGNOSIS — M13872 Other specified arthritis, left ankle and foot: Secondary | ICD-10-CM | POA: Diagnosis not present

## 2021-04-22 DIAGNOSIS — M13872 Other specified arthritis, left ankle and foot: Secondary | ICD-10-CM | POA: Diagnosis not present

## 2021-05-03 ENCOUNTER — Emergency Department (HOSPITAL_BASED_OUTPATIENT_CLINIC_OR_DEPARTMENT_OTHER): Payer: Federal, State, Local not specified - PPO

## 2021-05-03 ENCOUNTER — Emergency Department (HOSPITAL_BASED_OUTPATIENT_CLINIC_OR_DEPARTMENT_OTHER)
Admission: EM | Admit: 2021-05-03 | Discharge: 2021-05-03 | Disposition: A | Discharge status: Home / Self Care | Payer: Federal, State, Local not specified - PPO | Attending: Emergency Medicine | Admitting: Emergency Medicine

## 2021-05-03 ENCOUNTER — Encounter (HOSPITAL_BASED_OUTPATIENT_CLINIC_OR_DEPARTMENT_OTHER): Payer: Self-pay

## 2021-05-03 ENCOUNTER — Other Ambulatory Visit: Payer: Self-pay

## 2021-05-03 DIAGNOSIS — I10 Essential (primary) hypertension: Secondary | ICD-10-CM | POA: Diagnosis not present

## 2021-05-03 DIAGNOSIS — Z79899 Other long term (current) drug therapy: Secondary | ICD-10-CM | POA: Insufficient documentation

## 2021-05-03 DIAGNOSIS — N12 Tubulo-interstitial nephritis, not specified as acute or chronic: Secondary | ICD-10-CM | POA: Insufficient documentation

## 2021-05-03 DIAGNOSIS — J45909 Unspecified asthma, uncomplicated: Secondary | ICD-10-CM | POA: Diagnosis not present

## 2021-05-03 DIAGNOSIS — Z91040 Latex allergy status: Secondary | ICD-10-CM | POA: Insufficient documentation

## 2021-05-03 DIAGNOSIS — R109 Unspecified abdominal pain: Secondary | ICD-10-CM | POA: Diagnosis not present

## 2021-05-03 DIAGNOSIS — R1031 Right lower quadrant pain: Secondary | ICD-10-CM | POA: Diagnosis not present

## 2021-05-03 LAB — COMPREHENSIVE METABOLIC PANEL
ALT: 40 U/L (ref 0–44)
AST: 22 U/L (ref 15–41)
Albumin: 4.3 g/dL (ref 3.5–5.0)
Alkaline Phosphatase: 77 U/L (ref 38–126)
Anion gap: 12 (ref 5–15)
BUN: 15 mg/dL (ref 6–20)
CO2: 25 mmol/L (ref 22–32)
Calcium: 9.8 mg/dL (ref 8.9–10.3)
Chloride: 98 mmol/L (ref 98–111)
Creatinine, Ser: 0.85 mg/dL (ref 0.44–1.00)
GFR, Estimated: >60 mL/min (ref >60)
Glucose, Bld: 123 mg/dL — ABNORMAL HIGH (ref 70–99)
Potassium: 3.3 mmol/L — ABNORMAL LOW (ref 3.5–5.1)
Sodium: 135 mmol/L (ref 135–145)
Total Bilirubin: 0.7 mg/dL (ref 0.3–1.2)
Total Protein: 7.8 g/dL (ref 6.5–8.1)

## 2021-05-03 LAB — URINALYSIS, ROUTINE W REFLEX MICROSCOPIC
Glucose, UA: NEGATIVE mg/dL
Hgb urine dipstick: NEGATIVE
Ketones, ur: NEGATIVE mg/dL
Nitrite: POSITIVE — AB
Protein, ur: NEGATIVE mg/dL
Specific Gravity, Urine: 1.015 (ref 1.005–1.030)
pH: 7.5 (ref 5.0–8.0)

## 2021-05-03 LAB — URINALYSIS, MICROSCOPIC (REFLEX)

## 2021-05-03 LAB — CBC
HCT: 39.8 % (ref 36.0–46.0)
Hemoglobin: 13.2 g/dL (ref 12.0–15.0)
MCH: 29 pg (ref 26.0–34.0)
MCHC: 33.2 g/dL (ref 30.0–36.0)
MCV: 87.5 fL (ref 80.0–100.0)
Platelets: 254 10*3/uL (ref 150–400)
RBC: 4.55 MIL/uL (ref 3.87–5.11)
RDW: 12.8 % (ref 11.5–15.5)
WBC: 13.5 10*3/uL — ABNORMAL HIGH (ref 4.0–10.5)
nRBC: 0 % (ref 0.0–0.2)

## 2021-05-03 LAB — LIPASE, BLOOD: Lipase: <10 U/L — ABNORMAL LOW (ref 11–51)

## 2021-05-03 MED ORDER — ONDANSETRON 4 MG PO TBDP: 4.0000 mg | ORAL_TABLET | Freq: Three times a day (TID) | ORAL | 0 refills | Status: DC | PRN

## 2021-05-03 MED ORDER — IOHEXOL 300 MG/ML  SOLN
100.0000 mL | Freq: Once | INTRAMUSCULAR | Status: AC | PRN
  Administered 2021-05-03: 100 mL via INTRAVENOUS

## 2021-05-03 MED ORDER — HYDROCODONE-ACETAMINOPHEN 5-325 MG PO TABS
1.0000 | ORAL_TABLET | Freq: Four times a day (QID) | ORAL | 0 refills | Status: DC | PRN
Start: 2021-05-03 — End: 2022-01-10

## 2021-05-03 MED ORDER — LACTATED RINGERS IV BOLUS
1000.0000 mL | Freq: Once | INTRAVENOUS | Status: AC
  Administered 2021-05-03: 1000 mL via INTRAVENOUS

## 2021-05-03 MED ORDER — SODIUM CHLORIDE 0.9 % IV SOLN
1.0000 g | Freq: Once | INTRAVENOUS | Status: AC
  Administered 2021-05-03: 1 g via INTRAVENOUS
  Filled 2021-05-03: qty 10

## 2021-05-03 MED ORDER — CEPHALEXIN 500 MG PO CAPS: 500.0000 mg | ORAL_CAPSULE | Freq: Three times a day (TID) | ORAL | 0 refills | Status: AC

## 2021-05-03 MED ORDER — FENTANYL CITRATE PF 50 MCG/ML IJ SOSY
50.0000 ug | PREFILLED_SYRINGE | Freq: Once | INTRAMUSCULAR | Status: AC
Start: 2021-05-03 — End: 2021-05-03
  Administered 2021-05-03: 50 ug via INTRAVENOUS
  Filled 2021-05-03: qty 1

## 2021-05-03 NOTE — ED Provider Notes (Signed)
[facility] Provider Note  CSN: [id number] Arrival date & time: 05/03/21 1943    History Chief Complaint  Patient presents with   Abdominal Pain    Gabriela Alexander is a 50 y.o. female reports 2 days intermittent fever, abdominal cramping, one episode of vomiting. No diarrhea. No dysuria. Pain has localized to suprapubic and RLQ pain. Seen at [facility] where UA was reportedly normal. Sent to the ED for evaluation .   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 at age 43   ROBOTIC ASSISTED TOTAL HYSTERECTOMY WITH BILATERAL SALPINGO OOPHERECTOMY N/A 04/28/2019   Procedure: XI ROBOTIC ASSISTED TOTAL HYSTERECTOMY WITH BILATERAL SALPINGO OOPHORECTOMY;  Surgeon: Everitt Amber, MD;  Location: [facility];  Service: Gynecology;  Laterality: N/A;   SENTINEL NODE BIOPSY N/A 04/28/2019   Procedure: SENTINEL NODE BIOPSY;  Surgeon: Everitt Amber, MD;  Location: [facility];  Service: Gynecology;  Laterality: N/A;    Family History  Problem Relation Age of Onset   High Cholesterol Mother    Prostate cancer Father    High Cholesterol Father    Arthritis Father    Coronary artery disease Maternal Grandfather    Stroke Paternal Grandmother    Macular degeneration Maternal Grandmother    Ovarian cancer Neg Hx    Endometrial cancer Neg Hx     Social History   Tobacco Use   Smoking status: Never    Smokeless tobacco: Never  Vaping Use   Vaping Use: Never used  Substance Use Topics   Alcohol use: No   Drug use: Never     Home Medications Prior to Admission medications   Medication Sig Start Date End Date Taking? Authorizing Provider  cephALEXin (KEFLEX) 500 MG capsule Take 1 capsule (500 mg total) by mouth 3 (three) times daily for 7 days. 05/03/21 05/10/21 Yes Truddie Hidden, MD  HYDROcodone-acetaminophen (NORCO/VICODIN) 5-325 MG tablet Take 1 tablet by mouth every 6 (six) hours as needed for severe pain. 05/03/21  Yes Truddie Hidden, MD  ondansetron (ZOFRAN ODT) 4 MG disintegrating tablet Take 1 tablet (4 mg total) by mouth every 8 (eight) hours as needed for nausea or vomiting. 05/03/21  Yes Truddie Hidden, MD  albuterol (PROVENTIL HFA;VENTOLIN HFA) 108 (90 Base) MCG/ACT inhaler Inhale 1-2 puffs into the lungs every 6 (six) hours as needed for wheezing or shortness of breath.    [provider]  amitriptyline (ELAVIL) 25 MG tablet Take 75 mg by mouth at bedtime. For shooting leg pain.    [provider]  chlorthalidone (HYGROTON) 25 MG tablet Take 25 mg by mouth daily.    [provider]  DULERA 100-5 MCG/ACT AERO Inhale 2 puffs into the lungs 2 (two) times daily.  11/05/15  [provider]  ergocalciferol (VITAMIN D2) 1.25 MG (50000 UT) capsule Take 50,000 Units by mouth every Friday.     [provider]  lidocaine (LIDODERM) 5 % Place 1 patch onto the skin daily as needed for pain. Patient not taking: No sig reported 12/03/18   [provider]  LINZESS 145 MCG CAPS capsule Take 145 mcg by mouth every morning. 11/09/20   [provider]  montelukast (SINGULAIR) 10 MG tablet Take 10 mg by mouth every evening.     [provider]  naproxen (NAPROSYN) 500 MG tablet Take 500 mg by mouth daily. 12/03/18   [provider]  potassium chloride SA (KLOR-CON) 20 MEQ tablet Take 1 tablet (20 mEq total) by  mouth 2 (two) times daily for 3 doses. 04/25/19 04/27/19  Joylene John D, NP  Probiotic Product (PROBIOTIC PO) Take 1 capsule by mouth daily.    [provider]  verapamil (CALAN-SR) 240 MG CR tablet Take 240 mg by mouth at bedtime. 04/04/19   [provider]     Allergies    Prednisone and Latex   Review of Systems   Review of Systems A comprehensive review of systems was completed and negative except as noted in HPI.    Physical Exam BP 107/60   Pulse 88   Temp 99.2 F (37.3 C) (Oral)   Resp 18   Ht 5\' 6"  (1.676 m)   Wt 127 kg   LMP 01/24/2019 (Approximate)   SpO2 98%   BMI 45.19 kg/m   Physical Exam Vitals and nursing note reviewed.  Constitutional:      Appearance: Normal appearance.  HENT:     Head: Normocephalic and atraumatic.     Nose: Nose normal.     Mouth/Throat:     Mouth: Mucous membranes are moist.  Eyes:     Extraocular Movements: Extraocular movements intact.     Conjunctiva/sclera: Conjunctivae normal.  Cardiovascular:     Rate and Rhythm: Normal rate.  Pulmonary:     Effort: Pulmonary effort is normal.     Breath sounds: Normal breath sounds.  Abdominal:     General: Abdomen is flat.     Palpations: Abdomen is soft.     Tenderness: There is abdominal tenderness in the right lower quadrant. There is guarding. Positive signs include McBurney's sign. Negative signs include Murphy's sign.  Musculoskeletal:        General: No swelling. Normal range of motion.     Cervical back: Neck supple.  Skin:    General: Skin is warm and dry.  Neurological:     General: No focal deficit present.     Mental Status: She is alert.  Psychiatric:        Mood and Affect: Mood normal.     ED Results / Procedures / Treatments   Labs (all labs ordered are listed, but only abnormal results are displayed) Labs Reviewed  LIPASE, BLOOD - Abnormal; Notable for the following components:      Result Value   Lipase <10 (*)    All other components  within normal limits  COMPREHENSIVE METABOLIC PANEL - Abnormal; Notable for the following components:   Potassium 3.3 (*)    Glucose, Bld 123 (*)    All other components within normal limits  CBC - Abnormal; Notable for the following components:   WBC 13.5 (*)    All other components within normal limits  URINALYSIS, ROUTINE W REFLEX MICROSCOPIC - Abnormal; Notable for the  following components:   Color, Urine STRAW (*)    Bilirubin Urine MODERATE (*)    Nitrite POSITIVE (*)    Leukocytes,Ua MODERATE (*)    All other components within normal limits  URINALYSIS, MICROSCOPIC (REFLEX) - Abnormal; Notable for the following components:   Bacteria, UA RARE (*)    All other components within normal limits    EKG None   Radiology CT Abdomen Pelvis W Contrast  Result Date: 05/03/2021 CLINICAL DATA:  Right lower quadrant pain EXAM: CT ABDOMEN AND PELVIS WITH CONTRAST TECHNIQUE: Multidetector CT imaging of the abdomen and pelvis was performed using the standard protocol following bolus administration of intravenous contrast. CONTRAST:  145mL OMNIPAQUE IOHEXOL 300 MG/ML  SOLN COMPARISON:  None. FINDINGS: Lower chest: No acute abnormality. Hepatobiliary: No focal liver abnormality is seen. No gallstones, gallbladder wall thickening, or biliary dilatation. Pancreas: Unremarkable. No pancreatic ductal dilatation or surrounding inflammatory changes. Spleen: Normal in size without focal abnormality. Adrenals/Urinary Tract: Adrenal glands are normal. Mild right perinephric fat stranding with patchy hypoenhancement on delayed views. There is mild right hydronephrosis, but no convincing obstructing stone. Small pelvic calcifications on the right appear to be separate from the ureter. The bladder is unremarkable Stomach/Bowel: Stomach is within normal limits. Appendix appears normal. No evidence of bowel wall thickening, distention, or inflammatory changes. Vascular/Lymphatic: No significant vascular findings  are present. No enlarged abdominal or pelvic lymph nodes. Reproductive: Status post hysterectomy. No adnexal masses. Other: Negative for pelvic effusion or free air. Small fat containing umbilical hernia. Musculoskeletal: No acute or significant osseous findings. IMPRESSION: 1. Mild right perinephric fat stranding with slightly heterogeneous hypoenhancement of right kidney on delayed views suggesting possible pyelonephritis. Mild right hydronephrosis and prominence of right ureter but no definitive obstructing stone, there are punctate calcifications in the pelvis but these appear to be separate from the ureter 2. Negative for acute appendicitis Electronically Signed   By: Donavan Foil M.D.   On: 05/03/2021 21:24    Procedures Procedures  Medications Ordered in the ED Medications  lactated ringers bolus 1,000 mL (0 mLs Intravenous Stopped 05/03/21 2200)  fentaNYL (SUBLIMAZE) injection 50 mcg (50 mcg Intravenous Given 05/03/21 2117)  iohexol (OMNIPAQUE) 300 MG/ML solution 100 mL (100 mLs Intravenous Contrast Given 05/03/21 2059)  cefTRIAXone (ROCEPHIN) 1 g in sodium chloride 0.9 % 100 mL IVPB (0 g Intravenous Stopped 05/03/21 2232)     MDM Rules/Calculators/A&P MDM Patient's labs done in triage reviewed. WBC is mildly elevated, CMP/Lipase are unremarkable. UA consistent with infection, but exam is more concerning for appendicitis. Will give pain meds, fever and send for CT. She denies nausea at this time.   ED Course  I have reviewed the triage vital signs and the nursing notes.  Pertinent labs & imaging results that were available during my care of the patient were reviewed by me and considered in my medical decision making (see chart for details).  Clinical Course as of 05/03/21 2244  Ludwig Clarks May 03, 2021  2147 CT is neg for appendicitis, consistent with early pyelo. Will give Rocephin, reassess after IVF complete.  [CS]  2241 Patient feeling better, does not appear septic, pain is  controlled. No vomiting. Plan discharge with Rx for Keflex, Norco, Zofran and PCP follow up. Urine sent for culture, advised to return for any worsening symptoms.  [CS]    Clinical Course User Index [CS] Truddie Hidden, MD    Final Clinical Impression(s) / ED Diagnoses Final diagnoses:  Pyelonephritis    Rx /  DC Orders ED Discharge Orders          Ordered    cephALEXin (KEFLEX) 500 MG capsule  3 times daily        05/03/21 2244    HYDROcodone-acetaminophen (NORCO/VICODIN) 5-325 MG tablet  Every 6 hours PRN        05/03/21 2244    ondansetron (ZOFRAN ODT) 4 MG disintegrating tablet  Every 8 hours PRN        05/03/21 2244             Truddie Hidden, MD 05/03/21 2244

## 2021-05-03 NOTE — ED Triage Notes (Addendum)
Correction:  Patient here POV from Home with ABD Pain.  Patient states Wednesday AM she was having Nausea with Emesis that has since subsided. Today the Patient states she began having Lower ABD Pain that has worsened.  Patient did have 101.8 Fever today, Patient in Obvious Discomfort. Ambulatory. A&Ox4. GCS 15. No Urinary Symptoms.

## 2021-05-24 DIAGNOSIS — Z23 Encounter for immunization: Secondary | ICD-10-CM | POA: Diagnosis not present

## 2021-07-10 DIAGNOSIS — Z01419 Encounter for gynecological examination (general) (routine) without abnormal findings: Secondary | ICD-10-CM | POA: Diagnosis not present

## 2021-07-11 ENCOUNTER — Other Ambulatory Visit: Payer: Self-pay | Admitting: Family Medicine

## 2021-07-11 DIAGNOSIS — Z1231 Encounter for screening mammogram for malignant neoplasm of breast: Secondary | ICD-10-CM

## 2021-07-18 ENCOUNTER — Ambulatory Visit (INDEPENDENT_AMBULATORY_CARE_PROVIDER_SITE_OTHER): Payer: Federal, State, Local not specified - PPO

## 2021-07-18 ENCOUNTER — Other Ambulatory Visit: Payer: Self-pay

## 2021-07-18 DIAGNOSIS — Z1231 Encounter for screening mammogram for malignant neoplasm of breast: Secondary | ICD-10-CM

## 2021-10-04 DIAGNOSIS — G43009 Migraine without aura, not intractable, without status migrainosus: Secondary | ICD-10-CM | POA: Diagnosis not present

## 2021-10-04 DIAGNOSIS — G603 Idiopathic progressive neuropathy: Secondary | ICD-10-CM | POA: Diagnosis not present

## 2021-10-04 DIAGNOSIS — M545 Low back pain, unspecified: Secondary | ICD-10-CM | POA: Diagnosis not present

## 2021-10-04 DIAGNOSIS — Z760 Encounter for issue of repeat prescription: Secondary | ICD-10-CM | POA: Diagnosis not present

## 2021-11-19 DIAGNOSIS — H524 Presbyopia: Secondary | ICD-10-CM | POA: Diagnosis not present

## 2021-11-19 DIAGNOSIS — H47323 Drusen of optic disc, bilateral: Secondary | ICD-10-CM | POA: Diagnosis not present

## 2022-01-08 ENCOUNTER — Inpatient Hospital Stay: Payer: Federal, State, Local not specified - PPO | Admitting: Obstetrics & Gynecology

## 2022-01-10 ENCOUNTER — Encounter: Payer: Self-pay | Admitting: Obstetrics & Gynecology

## 2022-01-15 ENCOUNTER — Inpatient Hospital Stay
Payer: Federal, State, Local not specified - PPO | Attending: Obstetrics & Gynecology | Admitting: Obstetrics & Gynecology

## 2022-01-15 ENCOUNTER — Other Ambulatory Visit: Payer: Self-pay

## 2022-01-15 ENCOUNTER — Encounter: Payer: Self-pay | Admitting: Obstetrics & Gynecology

## 2022-01-15 VITALS — BP 153/84 | HR 91 | Temp 97.6°F | Resp 18 | Ht 66.0 in | Wt 281.0 lb

## 2022-01-15 DIAGNOSIS — C541 Malignant neoplasm of endometrium: Secondary | ICD-10-CM

## 2022-01-15 DIAGNOSIS — Z8542 Personal history of malignant neoplasm of other parts of uterus: Secondary | ICD-10-CM | POA: Insufficient documentation

## 2022-01-15 NOTE — Progress Notes (Addendum)
Follow Up Note: Gyn-Onc  Gabriela Alexander 51 y.o. female  CC: She presents for a f/u visit   HPI: The oncology history was reviewed.  Interval History: She presented to the ED in 11/22 w/acute RLQ, associated fever. Per the pt her sxs were attributed to an ascending UTI.  She denies any vaginal bleeding, abdominal/pelvic pain, cough, lethargy or increasing abdominal girth.   Review of Systems  Review of Systems  Constitutional:  Negative for malaise/fatigue and weight loss.  Respiratory:  Negative for shortness of breath and wheezing.   Cardiovascular:  Negative for chest pain and leg swelling.  Gastrointestinal:  Negative for abdominal pain, blood in stool, constipation, nausea and vomiting.  Genitourinary:  Negative for dysuria, frequency, hematuria and urgency.  Musculoskeletal:  Negative for joint pain and myalgias.  Neurological:  Negative for weakness.  Psychiatric/Behavioral:  Negative for depression. The patient does not have insomnia.    Current medications, allergy, social history, past surgical history, past medical history, family history were all reviewed.    Vitals:  BP (!) 153/84 (BP Location: Right Arm, Patient Position: Sitting)   Pulse 91   Temp 97.6 F (36.4 C) (Tympanic)   Resp 18   Ht 5' 6" (1.676 m)   Wt 281 lb (127.5 kg)   LMP 01/24/2019 (Approximate)   SpO2 100%   BMI 45.35 kg/m    Physical Exam:  Physical Exam Exam conducted with a chaperone present.  Constitutional:      General: She is not in acute distress. Cardiovascular:     Rate and Rhythm: Normal rate and regular rhythm.  Pulmonary:     Effort: Pulmonary effort is normal.     Breath sounds: Normal breath sounds. No wheezing or rhonchi.  Abdominal:     Palpations: Abdomen is soft.     Tenderness: There is no abdominal tenderness. There is no right CVA tenderness or left CVA tenderness.     Hernia: No hernia is present.  Genitourinary:    General: Normal vulva.     Urethra: No urethral  lesion.     Vagina: No lesions. No bleeding Musculoskeletal:     Cervical back: Neck supple.     Right lower leg: No edema.     Left lower leg: No edema.  Lymphadenopathy:     Upper Body:     Right upper body: No supraclavicular adenopathy.     Left upper body: No supraclavicular adenopathy.     Lower Body: No right inguinal adenopathy. No left inguinal adenopathy.  Skin:    Findings: No rash.  Neurological:     Mental Status: She is oriented to person, place, and time.   Assessment/Plan:  Endometrial ca [facility]) [name]  is a 51 y.o.  year old with a history of stage IA grade 1 endometrioid endometrial cancer (MMR normal). Negative symptom review, normal exam.  No evidence of recurrence    >Contine follow-up every 6 months for 5 years in accordance with NCCN guidelines   I personally spent 25 minutes face-to-face and non-face-to-face in the care of this patient, which includes all pre, intra, and post visit time on the date of service.   Lahoma Crocker, MD

## 2022-01-15 NOTE — Patient Instructions (Signed)
Return in 1 year ?

## 2022-01-15 NOTE — Assessment & Plan Note (Addendum)
[name]  is a 51 y.o.  year old with a history of stage IA grade 1 endometrioid endometrial cancer (MMR normal). Negative symptom review, normal exam.  No evidence of recurrence   >Contine follow-up every 6 months for 5 years in accordance with NCCN guidelines

## 2022-01-29 DIAGNOSIS — Z0000 Encounter for general adult medical examination without abnormal findings: Secondary | ICD-10-CM | POA: Diagnosis not present

## 2022-01-29 DIAGNOSIS — Z23 Encounter for immunization: Secondary | ICD-10-CM | POA: Diagnosis not present

## 2022-01-29 DIAGNOSIS — I10 Essential (primary) hypertension: Secondary | ICD-10-CM | POA: Diagnosis not present

## 2022-04-14 DIAGNOSIS — M19072 Primary osteoarthritis, left ankle and foot: Secondary | ICD-10-CM | POA: Diagnosis not present

## 2022-04-14 DIAGNOSIS — M25572 Pain in left ankle and joints of left foot: Secondary | ICD-10-CM | POA: Diagnosis not present

## 2022-05-19 DIAGNOSIS — J069 Acute upper respiratory infection, unspecified: Secondary | ICD-10-CM | POA: Diagnosis not present

## 2022-06-11 DIAGNOSIS — J45909 Unspecified asthma, uncomplicated: Secondary | ICD-10-CM | POA: Diagnosis not present

## 2022-06-11 DIAGNOSIS — R053 Chronic cough: Secondary | ICD-10-CM | POA: Diagnosis not present

## 2022-06-24 ENCOUNTER — Other Ambulatory Visit (HOSPITAL_BASED_OUTPATIENT_CLINIC_OR_DEPARTMENT_OTHER): Payer: Self-pay | Admitting: Family Medicine

## 2022-06-24 ENCOUNTER — Telehealth (HOSPITAL_BASED_OUTPATIENT_CLINIC_OR_DEPARTMENT_OTHER): Payer: Self-pay

## 2022-06-24 DIAGNOSIS — J45909 Unspecified asthma, uncomplicated: Secondary | ICD-10-CM

## 2022-06-30 ENCOUNTER — Ambulatory Visit (HOSPITAL_BASED_OUTPATIENT_CLINIC_OR_DEPARTMENT_OTHER)
Admission: RE | Admit: 2022-06-30 | Discharge: 2022-06-30 | Disposition: A | Discharge status: Home / Self Care | Payer: Federal, State, Local not specified - PPO | Source: Ambulatory Visit | Attending: Family Medicine | Admitting: Family Medicine

## 2022-06-30 DIAGNOSIS — J45909 Unspecified asthma, uncomplicated: Secondary | ICD-10-CM | POA: Diagnosis not present

## 2022-07-14 DIAGNOSIS — Z01419 Encounter for gynecological examination (general) (routine) without abnormal findings: Secondary | ICD-10-CM | POA: Diagnosis not present

## 2022-08-24 IMAGING — MG MM DIGITAL SCREENING BILAT W/ TOMO AND CAD
8 series · 8 of 24 positions shown · non-contrast
Comparison: Previous exam(s).

ACR Breast Density Category a: The breast tissue is almost entirely
fatty.

CLINICAL DATA: Screening.

EXAM:
DIGITAL SCREENING BILATERAL MAMMOGRAM WITH TOMOSYNTHESIS AND CAD
TECHNIQUE: Bilateral screening digital craniocaudal and mediolateral oblique
mammograms were obtained. Bilateral screening digital breast
tomosynthesis was performed. The images were evaluated with
computer-aided detection.

[R CC synth-2D]
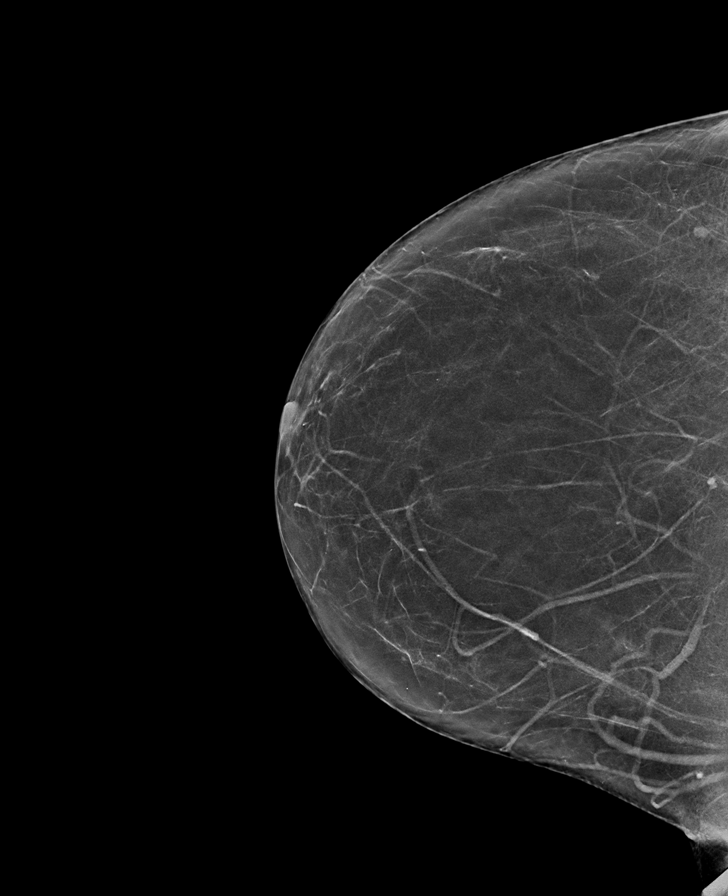

[R MLO synth-2D]
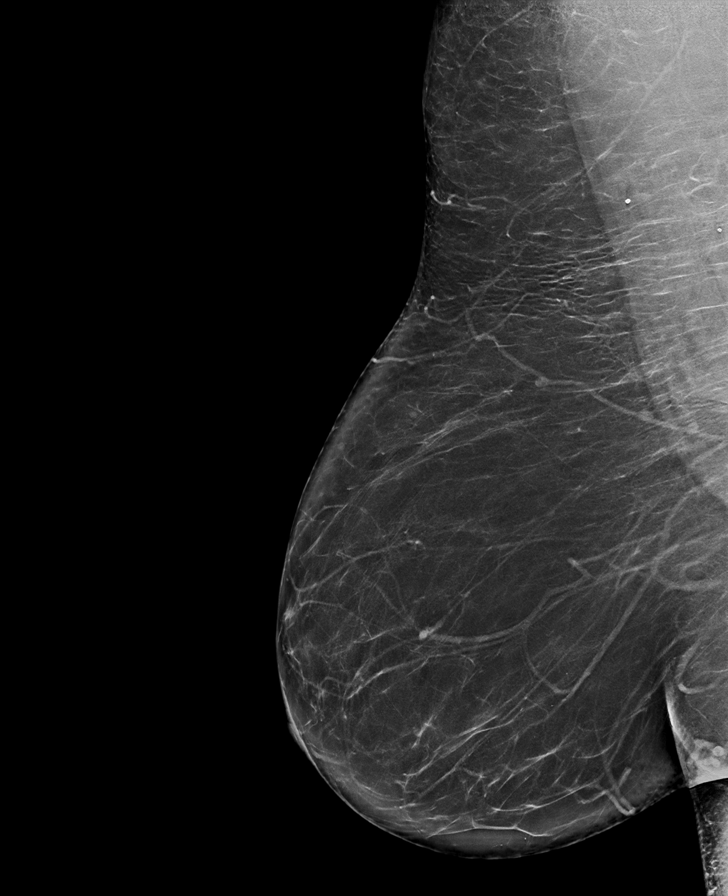

[L MLO synth-2D]
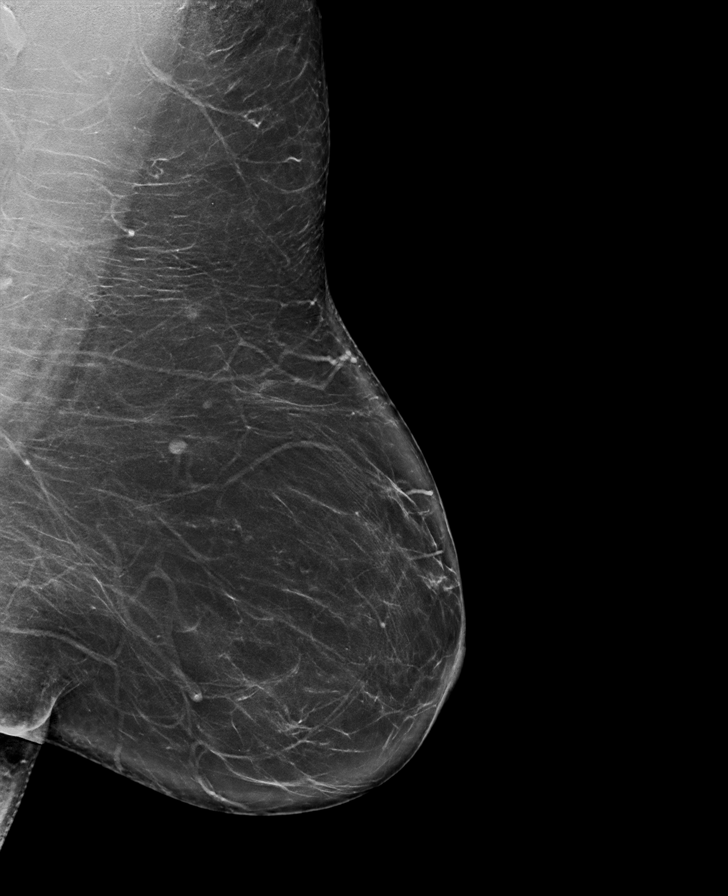

[L CC synth-2D]
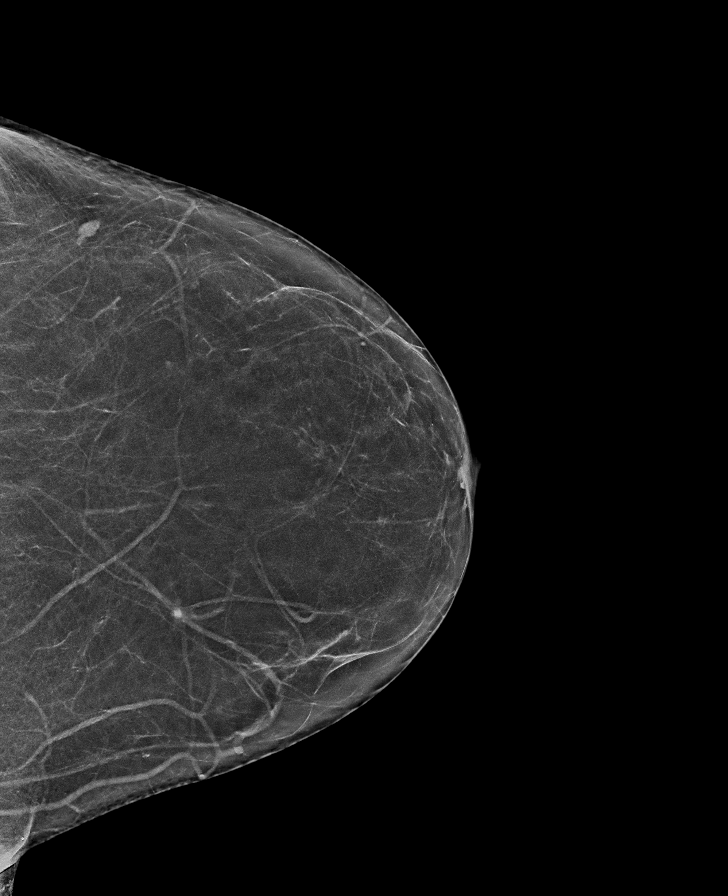

[R CC tomo · tomo slice 33/66.0]
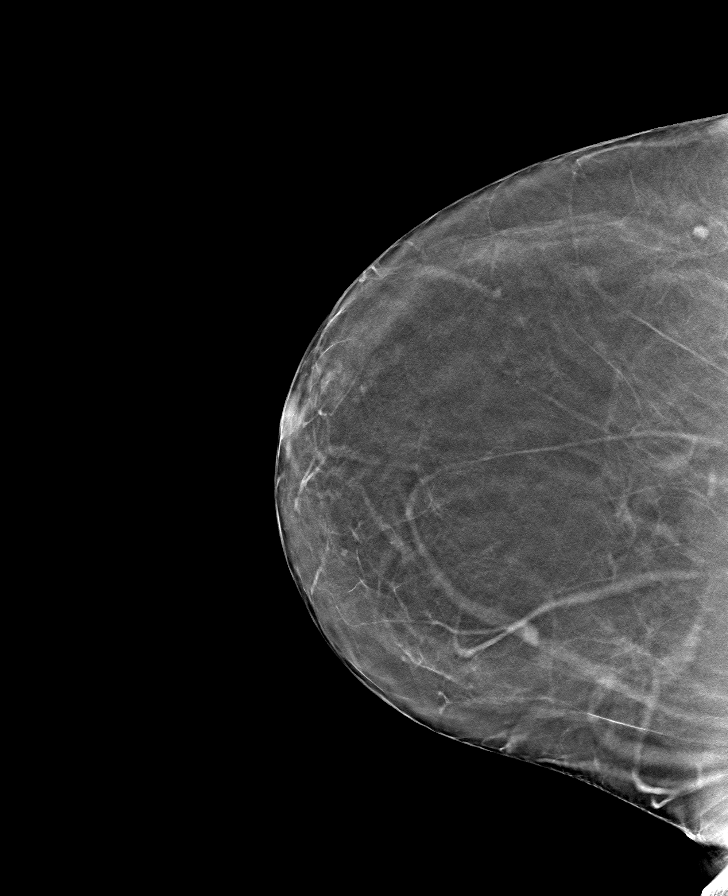

[L MLO tomo · tomo slice 45/88.0]
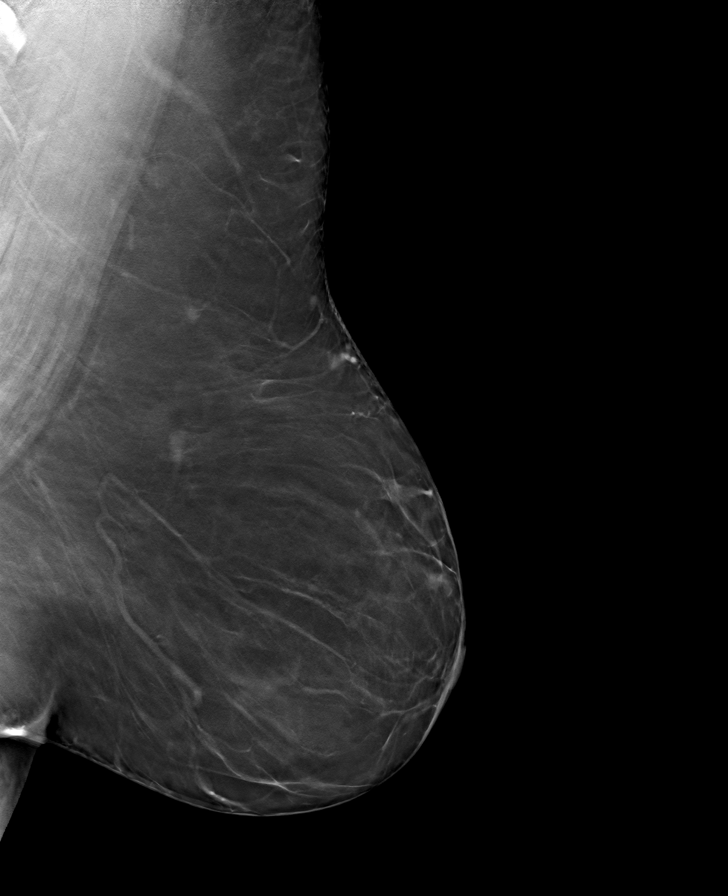

[L CC tomo · tomo slice 33/66.0]
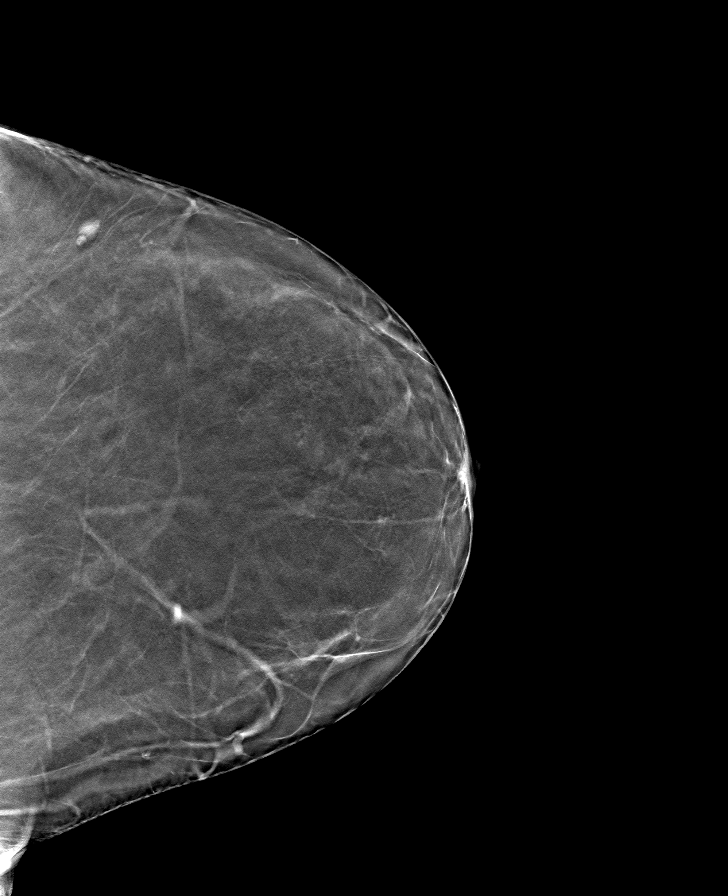

[R MLO tomo · tomo slice 43/84.0]
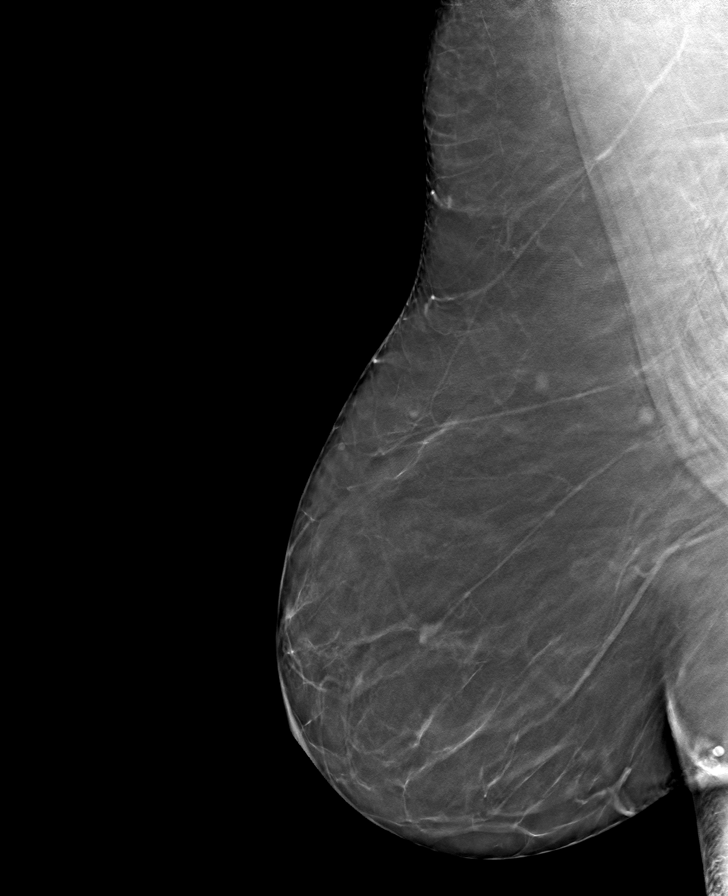

[8 of 24 positions shown; findings below may reference images not displayed]

FINDINGS: There are no findings suspicious for malignancy.
IMPRESSION: No mammographic evidence of malignancy. A result letter of this
screening mammogram will be mailed directly to the patient.

RECOMMENDATION:
Screening mammogram in one year. (Code:0E-3-N98)

BI-RADS CATEGORY  1: Negative.

## 2022-11-21 DIAGNOSIS — M79672 Pain in left foot: Secondary | ICD-10-CM | POA: Diagnosis not present

## 2022-11-21 DIAGNOSIS — M2550 Pain in unspecified joint: Secondary | ICD-10-CM | POA: Diagnosis not present

## 2022-11-21 DIAGNOSIS — G43009 Migraine without aura, not intractable, without status migrainosus: Secondary | ICD-10-CM | POA: Diagnosis not present

## 2022-11-21 DIAGNOSIS — E559 Vitamin D deficiency, unspecified: Secondary | ICD-10-CM | POA: Diagnosis not present

## 2022-12-17 ENCOUNTER — Other Ambulatory Visit: Payer: Self-pay | Admitting: Family Medicine

## 2022-12-17 ENCOUNTER — Telehealth: Payer: Self-pay | Admitting: *Deleted

## 2022-12-17 DIAGNOSIS — Z1231 Encounter for screening mammogram for malignant neoplasm of breast: Secondary | ICD-10-CM

## 2022-12-17 NOTE — Telephone Encounter (Signed)
Gabriela Alexander called the office requesting an annual follow up appointment with Dr. Tamela Oddi, who she last saw in June of 2023.  Patient was given an appointment for Wednesday, July 24th. At 11:00 with arrival time of 10:45 for check in. Patient agreed to date and time. No further questions or concerns at this time.

## 2022-12-31 ENCOUNTER — Ambulatory Visit: Payer: Federal, State, Local not specified - PPO

## 2023-01-08 ENCOUNTER — Encounter: Payer: Self-pay | Admitting: Obstetrics & Gynecology

## 2023-01-14 ENCOUNTER — Encounter: Payer: Self-pay | Admitting: Obstetrics & Gynecology

## 2023-01-14 ENCOUNTER — Other Ambulatory Visit: Payer: Self-pay

## 2023-01-14 ENCOUNTER — Inpatient Hospital Stay
Payer: Federal, State, Local not specified - PPO | Attending: Obstetrics & Gynecology | Admitting: Obstetrics & Gynecology

## 2023-01-14 VITALS — BP 140/88 | HR 90 | Temp 98.5°F | Resp 18 | Ht 66.0 in | Wt 274.4 lb

## 2023-01-14 DIAGNOSIS — C541 Malignant neoplasm of endometrium: Secondary | ICD-10-CM

## 2023-01-14 DIAGNOSIS — Z8542 Personal history of malignant neoplasm of other parts of uterus: Secondary | ICD-10-CM | POA: Diagnosis not present

## 2023-01-14 NOTE — Patient Instructions (Addendum)
Return in 1 year, please call in the spring of 2025 to schedule for July

## 2023-01-14 NOTE — Assessment & Plan Note (Signed)
Ms. Brenley Priore  is a 52 y.o.  year old with a history of stage IA grade 1 endometrioid endometrial cancer (MMR normal). Negative symptom review, normal exam.  No evidence of recurrence      >Contine follow-up every 6 months for 5 years in accordance with NCCN guidelines    I personally spent 25 minutes face-to-face and non-face-to-face in the care of this patient, which includes all pre, intra, and post visit time on the date of service.

## 2023-01-14 NOTE — Progress Notes (Signed)
Follow Up Note: Gyn-Onc  Gabriela Alexander 52 y.o. female  CC: She presents for a f/u visit   HPI: The oncology history was reviewed.  Interval History:   She denies any vaginal bleeding, abdominal/pelvic pain, cough, lethargy or increasing abdominal girth.   Review of Systems  Review of Systems  Constitutional:  Negative for malaise/fatigue and weight loss.  Respiratory:  Negative for shortness of breath and wheezing.   Cardiovascular:  Negative for chest pain and leg swelling.  Gastrointestinal:  Negative for abdominal pain, blood in stool, constipation, nausea and vomiting.  Genitourinary:  Negative for dysuria, frequency, hematuria and urgency.  Musculoskeletal:  Negative for joint pain and myalgias.  Neurological:  Negative for weakness.  Psychiatric/Behavioral:  Negative for depression. The patient does not have insomnia.    Current medications, allergy, social history, past surgical history, past medical history, family history were all reviewed.    Vitals:  BP (!) 140/88 Comment: manual recheck  Pulse 90   Temp 98.5 F (36.9 C) (Tympanic)   Resp 18   Ht 5\' 6"  (1.676 m)   Wt 274 lb 6.4 oz (124.5 kg)   LMP 01/24/2019 (Approximate)   SpO2 100%   BMI 44.29 kg/m     Physical Exam Exam conducted with a chaperone present.  Constitutional:      General: She is not in acute distress. Cardiovascular:     Rate and Rhythm: Normal rate and regular rhythm.  Pulmonary:     Effort: Pulmonary effort is normal.     Breath sounds: Normal breath sounds. No wheezing or rhonchi.  Abdominal:     Palpations: Abdomen is soft.     Tenderness: There is no abdominal tenderness. There is no right CVA tenderness or left CVA tenderness.     Hernia: No hernia is present.  Genitourinary:    General: Normal vulva.     Urethra: No urethral lesion.     Vagina: No lesions. No bleeding Musculoskeletal:     Cervical back: Neck supple.     Right lower leg: No edema.     Left lower leg: No edema.   Lymphadenopathy:     Upper Body:     Right upper body: No supraclavicular adenopathy.     Left upper body: No supraclavicular adenopathy.     Lower Body: No right inguinal adenopathy. No left inguinal adenopathy.  Skin:    Findings: No rash.  Neurological:     Mental Status: She is oriented to person, place, and time.   Assessment/Plan:  Endometrial ca [facility]) [name]  is a 52 y.o.  year old with a history of stage IA grade 1 endometrioid endometrial cancer (MMR normal). Negative symptom review, normal exam.  No evidence of recurrence      >Contine follow-up every 6 months for 5 years in accordance with NCCN guidelines    I personally spent 25 minutes face-to-face and non-face-to-face in the care of this patient, which includes all pre, intra, and post visit time on the date of service.         Antionette Char, MD

## 2023-01-28 ENCOUNTER — Ambulatory Visit: Payer: Federal, State, Local not specified - PPO

## 2023-01-29 ENCOUNTER — Ambulatory Visit: Payer: Federal, State, Local not specified - PPO

## 2023-01-29 DIAGNOSIS — Z1231 Encounter for screening mammogram for malignant neoplasm of breast: Secondary | ICD-10-CM

## 2023-02-02 DIAGNOSIS — Z0000 Encounter for general adult medical examination without abnormal findings: Secondary | ICD-10-CM | POA: Diagnosis not present

## 2023-02-02 DIAGNOSIS — I10 Essential (primary) hypertension: Secondary | ICD-10-CM | POA: Diagnosis not present

## 2023-02-03 ENCOUNTER — Other Ambulatory Visit: Payer: Self-pay | Admitting: Family Medicine

## 2023-02-03 DIAGNOSIS — R928 Other abnormal and inconclusive findings on diagnostic imaging of breast: Secondary | ICD-10-CM

## 2023-02-03 DIAGNOSIS — R7301 Impaired fasting glucose: Secondary | ICD-10-CM | POA: Diagnosis not present

## 2023-02-05 ENCOUNTER — Other Ambulatory Visit: Payer: Self-pay | Admitting: Family Medicine

## 2023-02-05 ENCOUNTER — Ambulatory Visit
Admission: RE | Admit: 2023-02-05 | Discharge: 2023-02-05 | Disposition: A | Discharge status: Home / Self Care | Payer: Federal, State, Local not specified - PPO | Source: Ambulatory Visit | Attending: Family Medicine | Admitting: Family Medicine

## 2023-02-05 DIAGNOSIS — R590 Localized enlarged lymph nodes: Secondary | ICD-10-CM | POA: Diagnosis not present

## 2023-02-05 DIAGNOSIS — N6320 Unspecified lump in the left breast, unspecified quadrant: Secondary | ICD-10-CM

## 2023-02-05 DIAGNOSIS — R928 Other abnormal and inconclusive findings on diagnostic imaging of breast: Secondary | ICD-10-CM

## 2023-02-09 DIAGNOSIS — Z135 Encounter for screening for eye and ear disorders: Secondary | ICD-10-CM | POA: Diagnosis not present

## 2023-02-09 DIAGNOSIS — H47323 Drusen of optic disc, bilateral: Secondary | ICD-10-CM | POA: Diagnosis not present

## 2023-02-09 DIAGNOSIS — H524 Presbyopia: Secondary | ICD-10-CM | POA: Diagnosis not present

## 2023-02-10 ENCOUNTER — Ambulatory Visit
Admission: RE | Admit: 2023-02-10 | Discharge: 2023-02-10 | Disposition: A | Discharge status: Home / Self Care | Payer: Federal, State, Local not specified - PPO | Source: Ambulatory Visit | Attending: Family Medicine | Admitting: Family Medicine

## 2023-02-10 DIAGNOSIS — N6325 Unspecified lump in the left breast, overlapping quadrants: Secondary | ICD-10-CM | POA: Diagnosis not present

## 2023-02-10 DIAGNOSIS — N6320 Unspecified lump in the left breast, unspecified quadrant: Secondary | ICD-10-CM

## 2023-02-10 DIAGNOSIS — R590 Localized enlarged lymph nodes: Secondary | ICD-10-CM | POA: Diagnosis not present

## 2023-02-10 HISTORY — PX: BREAST BIOPSY: SHX20

## 2023-03-23 DIAGNOSIS — G603 Idiopathic progressive neuropathy: Secondary | ICD-10-CM | POA: Diagnosis not present

## 2023-03-23 DIAGNOSIS — E559 Vitamin D deficiency, unspecified: Secondary | ICD-10-CM | POA: Diagnosis not present

## 2023-03-23 DIAGNOSIS — M79672 Pain in left foot: Secondary | ICD-10-CM | POA: Diagnosis not present

## 2023-07-17 DIAGNOSIS — Z01419 Encounter for gynecological examination (general) (routine) without abnormal findings: Secondary | ICD-10-CM | POA: Diagnosis not present

## 2023-09-04 DIAGNOSIS — K5909 Other constipation: Secondary | ICD-10-CM | POA: Diagnosis not present

## 2023-09-21 DIAGNOSIS — G43009 Migraine without aura, not intractable, without status migrainosus: Secondary | ICD-10-CM | POA: Diagnosis not present

## 2023-09-21 DIAGNOSIS — E559 Vitamin D deficiency, unspecified: Secondary | ICD-10-CM | POA: Diagnosis not present

## 2023-09-21 DIAGNOSIS — M79672 Pain in left foot: Secondary | ICD-10-CM | POA: Diagnosis not present

## 2023-09-28 DIAGNOSIS — I10 Essential (primary) hypertension: Secondary | ICD-10-CM | POA: Diagnosis not present

## 2023-10-08 DIAGNOSIS — K5900 Constipation, unspecified: Secondary | ICD-10-CM | POA: Diagnosis not present

## 2023-10-08 DIAGNOSIS — R1084 Generalized abdominal pain: Secondary | ICD-10-CM | POA: Diagnosis not present

## 2023-10-08 DIAGNOSIS — K581 Irritable bowel syndrome with constipation: Secondary | ICD-10-CM | POA: Diagnosis not present

## 2023-12-01 DIAGNOSIS — K581 Irritable bowel syndrome with constipation: Secondary | ICD-10-CM | POA: Diagnosis not present

## 2023-12-01 DIAGNOSIS — R109 Unspecified abdominal pain: Secondary | ICD-10-CM | POA: Diagnosis not present

## 2023-12-02 ENCOUNTER — Emergency Department (HOSPITAL_BASED_OUTPATIENT_CLINIC_OR_DEPARTMENT_OTHER)
Admission: EM | Admit: 2023-12-02 | Discharge: 2023-12-02 | Disposition: A | Discharge status: Home / Self Care | Attending: Emergency Medicine | Admitting: Emergency Medicine

## 2023-12-02 ENCOUNTER — Encounter (HOSPITAL_BASED_OUTPATIENT_CLINIC_OR_DEPARTMENT_OTHER): Payer: Self-pay

## 2023-12-02 ENCOUNTER — Other Ambulatory Visit: Payer: Self-pay

## 2023-12-02 DIAGNOSIS — W268XXA Contact with other sharp object(s), not elsewhere classified, initial encounter: Secondary | ICD-10-CM | POA: Insufficient documentation

## 2023-12-02 DIAGNOSIS — Z79899 Other long term (current) drug therapy: Secondary | ICD-10-CM | POA: Insufficient documentation

## 2023-12-02 DIAGNOSIS — I10 Essential (primary) hypertension: Secondary | ICD-10-CM | POA: Diagnosis not present

## 2023-12-02 DIAGNOSIS — Z91040 Latex allergy status: Secondary | ICD-10-CM | POA: Diagnosis not present

## 2023-12-02 DIAGNOSIS — S61213A Laceration without foreign body of left middle finger without damage to nail, initial encounter: Secondary | ICD-10-CM | POA: Insufficient documentation

## 2023-12-02 DIAGNOSIS — S61210A Laceration without foreign body of right index finger without damage to nail, initial encounter: Secondary | ICD-10-CM | POA: Diagnosis not present

## 2023-12-02 DIAGNOSIS — S61211A Laceration without foreign body of left index finger without damage to nail, initial encounter: Secondary | ICD-10-CM | POA: Diagnosis not present

## 2023-12-02 DIAGNOSIS — S6992XA Unspecified injury of left wrist, hand and finger(s), initial encounter: Secondary | ICD-10-CM | POA: Diagnosis not present

## 2023-12-02 DIAGNOSIS — J45909 Unspecified asthma, uncomplicated: Secondary | ICD-10-CM | POA: Insufficient documentation

## 2023-12-02 DIAGNOSIS — Z7951 Long term (current) use of inhaled steroids: Secondary | ICD-10-CM | POA: Diagnosis not present

## 2023-12-02 NOTE — ED Provider Notes (Signed)
 [name] [facility] AT [facility] Provider Note   CSN: [id number] Arrival date & time: 12/02/23  2001     History HTN , asthma  Chief Complaint  Patient presents with   Laceration    Gabriela Alexander is a 53 y.o. female.  53 y.o female with a PMH of HTN, Asthma presents to the ED with a chief complaint of left hand injury x this evening. Reports she was cutting cheese with a knife when suddenly she cut the left middle finger, also she took off skin of her left index finger.  She reports the bleeding was controlled, wrapped this in a tight dressing, went to church but then noticed the finger continued to bleed.  She is currently not on any blood thinners.  Her last tetanus immunization was approximately 3 years ago according to her chart.  No other complaints reported.  The history is provided by the patient.  Laceration Associated symptoms: no fever        Home Medications Prior to Admission medications   Medication Sig Start Date End Date Taking? Authorizing Provider  albuterol  (PROVENTIL  HFA;VENTOLIN  HFA) 108 (90 Base) MCG/ACT inhaler Inhale 1-2 puffs into the lungs every 6 (six) hours as needed for wheezing or shortness of breath.    [provider]  amitriptyline (ELAVIL) 25 MG tablet Take 75 mg by mouth at bedtime. For shooting leg pain.    [provider]  chlorthalidone (HYGROTON) 25 MG tablet Take 25 mg by mouth daily.    [provider]  diclofenac Sodium (VOLTAREN) 1 % GEL APPLY 2 GRAMS TO THE AFFECTED AREA(S) BY TOPICAL ROUTE 4 TIMES PER DAY 02/24/21   [provider]  DULERA 100-5 MCG/ACT AERO Inhale 2 puffs into the lungs 2 (two) times daily.  11/05/15   [provider]  ergocalciferol (VITAMIN D2) 1.25 MG (50000 UT) capsule Take 50,000 Units by mouth every Friday.     [provider]  LINZESS 145 MCG CAPS capsule Take 145 mcg by mouth every morning. 11/09/20   [provider]  montelukast  (SINGULAIR) 10 MG tablet Take 10 mg by mouth every evening.     [provider]  naproxen (NAPROSYN) 500 MG tablet Take 500 mg by mouth daily. 12/03/18   [provider]  potassium chloride  SA (KLOR-CON ) 20 MEQ tablet Take 1 tablet (20 mEq total) by mouth 2 (two) times daily for 3 doses. 04/25/19 04/27/19  Vira Grieves D, NP  Probiotic Product (PROBIOTIC PO) Take 1 capsule by mouth daily.    [provider]  verapamil (CALAN-SR) 240 MG CR tablet Take 240 mg by mouth at bedtime. 04/04/19   [provider]      Allergies    Prednisone and Latex    Review of Systems   Review of Systems  Constitutional:  Negative for fever.  Skin:  Positive for wound.    Physical Exam Updated Vital Signs BP (!) 132/100 (BP Location: Right Arm)   Pulse 95   Temp 97.8 F (36.6 C) (Temporal)   Resp 18   Ht 5' 6 (1.676 m)   Wt 117.9 kg   LMP 01/24/2019 (Approximate)   SpO2 94%   BMI 41.97 kg/m  Physical Exam Vitals and nursing note reviewed.  Constitutional:      Appearance: Normal appearance.  HENT:     Head: Normocephalic and atraumatic.     Mouth/Throat:     Mouth: Mucous membranes are moist.  Cardiovascular:  Rate and Rhythm: Normal rate.     Pulses:          Radial pulses are 2+ on the left side.     Comments: Small superficial laceration to the left middle finger and partial skin removal from left index finger.  Pulmonary:     Effort: Pulmonary effort is normal.  Abdominal:     General: Abdomen is flat.  Musculoskeletal:     Cervical back: Normal range of motion and neck supple.  Skin:    General: Skin is warm and dry.  Neurological:     Mental Status: She is alert and oriented to person, place, and time.     ED Results / Procedures / Treatments   Labs (all labs ordered are listed, but only abnormal results are displayed) Labs Reviewed - No data to display  EKG None  Radiology No results found.  Procedures .Laceration  Repair  Date/Time: 12/02/2023 9:36 PM  Performed by: Amarri Michaelson, PA-C Authorized by: Madalyne Husk, PA-C   Consent:    Consent obtained:  Verbal   Risks discussed:  Infection Universal protocol:    Patient identity confirmed:  Verbally with patient Laceration details:    Location:  Finger   Finger location:  L long finger   Length (cm):  0.5   Depth (mm):  0.5 Treatment:    Area cleansed with:  Saline   Amount of cleaning:  Standard   Irrigation solution:  Sterile water  Skin repair:    Repair method:  Tissue adhesive Approximation:    Approximation:  Close Repair type:    Repair type:  Simple Post-procedure details:    Dressing:  Open (no dressing)   Procedure completion:  Tolerated well, no immediate complications     Medications Ordered in ED Medications - No data to display  ED Course/ Medical Decision Making/ A&P                                 Medical Decision Making   Patient here with small laceration to her left middle finger, left right index finger.  Not on any blood thinners, reports the bleeding had been controlled, however while in the ED there is no active bleeding, wounds were cleaned by me, along with undress fully, no active bleeding noted.  Did discuss with her if she feels more comfortable we will apply Dermabond to the wounds and have her follow-up with PCP as needed.  She is agreeable of this.  According to chart review, last tetanus immunization was in 2022, will not update this today.  Hemodynamically stable for discharge.   Portions of this note were generated with Scientist, clinical (histocompatibility and immunogenetics). Dictation errors may occur despite best attempts at proofreading.   Final Clinical Impression(s) / ED Diagnoses Final diagnoses:  Injury, hand, left, initial encounter    Rx / DC Orders ED Discharge Orders     None         Berdine Bread 12/02/23 2137    Ninetta Basket, MD 12/03/23 [phone]

## 2023-12-02 NOTE — ED Triage Notes (Signed)
 Patient cut top of middle finger and pointer finger on right hand around 3pm today when cutting cheese. Pt reports wound will just start bleeding randomly. No active bleeding at this time. Laceration approximated. New dressing applied.

## 2023-12-02 NOTE — Discharge Instructions (Addendum)
 You had Dermabond applied to your left middle finger, this should fall off on its own.  Please do not wet your finger within the next 24 hours.  You may take Tylenol  ibuprofen ordered to help with your pain.

## 2024-01-11 ENCOUNTER — Other Ambulatory Visit: Payer: Self-pay | Admitting: Family Medicine

## 2024-01-11 ENCOUNTER — Telehealth: Payer: Self-pay

## 2024-01-11 DIAGNOSIS — Z1231 Encounter for screening mammogram for malignant neoplasm of breast: Secondary | ICD-10-CM

## 2024-01-11 NOTE — Telephone Encounter (Signed)
 Patient called and wanted to schedule her yearly check up.  Scheduled for  7/30 at 2:15pm with Dr Leonce Ada.

## 2024-01-20 ENCOUNTER — Encounter: Payer: Self-pay | Admitting: Obstetrics & Gynecology

## 2024-01-20 ENCOUNTER — Inpatient Hospital Stay: Attending: Obstetrics & Gynecology | Admitting: Obstetrics & Gynecology

## 2024-01-20 VITALS — BP 119/78 | HR 88 | Temp 98.4°F | Resp 20 | Wt 264.4 lb

## 2024-01-20 DIAGNOSIS — Z8542 Personal history of malignant neoplasm of other parts of uterus: Secondary | ICD-10-CM | POA: Diagnosis not present

## 2024-01-20 DIAGNOSIS — C541 Malignant neoplasm of endometrium: Secondary | ICD-10-CM

## 2024-01-20 NOTE — Progress Notes (Signed)
 Follow Up Note: Gyn-Onc  Gabriela Alexander 53 y.o. female  CC: She presents for a f/u visit   HPI: The oncology history was reviewed.  Interval History:   She denies any vaginal bleeding, abdominal/pelvic pain, cough, lethargy or increasing abdominal girth. She suffers from chronic constipation.  Discussed the use of AI scribe software for clinical note transcription with the patient, who gave verbal consent to proceed.    Review of Systems  Review of Systems  Constitutional:  Negative for malaise/fatigue and weight loss.  Respiratory:  Negative for shortness of breath and wheezing.   Cardiovascular:  Negative for chest pain and leg swelling.  Gastrointestinal:  See above  Genitourinary:  Negative for dysuria, frequency, hematuria and urgency.  Musculoskeletal:  Negative for joint pain and myalgias.  Neurological:  Negative for weakness.  Psychiatric/Behavioral:  Negative for depression. The patient does not have insomnia.    Current medications, allergy, social history, past surgical history, past medical history, family history were all reviewed.    Vitals:  BP 119/78 (BP Location: Left Arm, Patient Position: Sitting)   Pulse 88   Temp 98.4 F (36.9 C) (Oral)   Resp 20   Wt 264 lb 6.4 oz (119.9 kg)   LMP 01/24/2019 (Approximate)   SpO2 100%   BMI 42.68 kg/m      Physical Exam Exam conducted with a chaperone present.  Constitutional:      General: She is not in acute distress. Cardiovascular:     Rate and Rhythm: Normal rate and regular rhythm.  Pulmonary:     Effort: Pulmonary effort is normal.     Breath sounds: Normal breath sounds. No wheezing or rhonchi.  Abdominal:     Palpations: Abdomen is soft.     Tenderness: There is no abdominal tenderness. There is no right CVA tenderness or left CVA tenderness.     Hernia: No hernia is present.  Genitourinary:    General: Normal vulva.     Urethra: No urethral lesion.     Vagina: No lesions. No  bleeding Musculoskeletal:     Cervical back: Neck supple.     Right lower leg: No edema.     Left lower leg: No edema.  Lymphadenopathy:     Upper Body:     Right upper body: No supraclavicular adenopathy.     Left upper body: No supraclavicular adenopathy.     Lower Body: No right inguinal adenopathy. No left inguinal adenopathy.  Skin:    Findings: No rash.  Neurological:     Mental Status: She is oriented to person, place, and time.   Assessment/Plan:  Endometrial ca [facility]) [name]  is a 53 y.o.  year old with a history of stage IA grade 1 endometrioid endometrial cancer (MMR normal). Negative symptom review, normal exam.  No evidence of recurrence      >Contine follow-up every 6 months for 5 years in accordance with NCCN guidelines > Plans continued surveillance w/her gynecologist with whom she has established care    I personally spent 25 minutes face-to-face and non-face-to-face in the care of this patient, which includes all pre, intra, and post visit time on the date of service.          Olam Mill, MD

## 2024-01-20 NOTE — Patient Instructions (Signed)
 Plan to follow up with your regular providers and gynecologist for your well woman care.   No additional follow up needed with GYN Oncology unless needs arise in the future.  Please call the office for any needs.

## 2024-01-20 NOTE — Assessment & Plan Note (Addendum)
 [name]  is a 53 y.o.  year old with a history of stage IA grade 1 endometrioid endometrial cancer (MMR normal). Negative symptom review, normal exam.  No evidence of recurrence      >Contine follow-up every 6 months for 5 years in accordance with NCCN guidelines > Plans continued surveillance w/her gynecologist with whom she has established care    I personally spent 25 minutes face-to-face and non-face-to-face in the care of this patient, which includes all pre, intra, and post visit time on the date of service.

## 2024-02-01 DIAGNOSIS — R300 Dysuria: Secondary | ICD-10-CM | POA: Diagnosis not present

## 2024-02-05 DIAGNOSIS — J45909 Unspecified asthma, uncomplicated: Secondary | ICD-10-CM | POA: Diagnosis not present

## 2024-02-05 DIAGNOSIS — I10 Essential (primary) hypertension: Secondary | ICD-10-CM | POA: Diagnosis not present

## 2024-02-05 DIAGNOSIS — R7301 Impaired fasting glucose: Secondary | ICD-10-CM | POA: Diagnosis not present

## 2024-02-05 DIAGNOSIS — Z0000 Encounter for general adult medical examination without abnormal findings: Secondary | ICD-10-CM | POA: Diagnosis not present

## 2024-02-05 DIAGNOSIS — Z13220 Encounter for screening for lipoid disorders: Secondary | ICD-10-CM | POA: Diagnosis not present

## 2024-02-05 DIAGNOSIS — K5900 Constipation, unspecified: Secondary | ICD-10-CM | POA: Diagnosis not present

## 2024-02-19 ENCOUNTER — Ambulatory Visit
Admission: RE | Admit: 2024-02-19 | Discharge: 2024-02-19 | Disposition: A | Discharge status: Home / Self Care | Source: Ambulatory Visit | Attending: Family Medicine | Admitting: Family Medicine

## 2024-02-19 DIAGNOSIS — Z1231 Encounter for screening mammogram for malignant neoplasm of breast: Secondary | ICD-10-CM | POA: Diagnosis not present

## 2024-03-18 DIAGNOSIS — K581 Irritable bowel syndrome with constipation: Secondary | ICD-10-CM | POA: Diagnosis not present

## 2024-03-18 DIAGNOSIS — K5900 Constipation, unspecified: Secondary | ICD-10-CM | POA: Diagnosis not present

## 2024-03-18 DIAGNOSIS — R1084 Generalized abdominal pain: Secondary | ICD-10-CM | POA: Diagnosis not present

## 2024-04-14 DIAGNOSIS — H47323 Drusen of optic disc, bilateral: Secondary | ICD-10-CM | POA: Diagnosis not present

## 2024-04-14 DIAGNOSIS — H524 Presbyopia: Secondary | ICD-10-CM | POA: Diagnosis not present
